# Patient Record
Sex: Male | Born: 1990 | Race: Black or African American | Hispanic: No | Marital: Single | Smoking: Current every day smoker
Health system: Southern US, Community
[De-identification: ages and names within clinical notes are randomized; demographics above are authoritative.]

## PROBLEM LIST (undated history)

## (undated) DIAGNOSIS — I10 Essential (primary) hypertension: Secondary | ICD-10-CM

## (undated) DIAGNOSIS — D869 Sarcoidosis, unspecified: Secondary | ICD-10-CM

---
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
## 2008-02-01 ENCOUNTER — Emergency Department: Payer: Self-pay | Admitting: Internal Medicine

## 2008-03-23 ENCOUNTER — Emergency Department: Payer: Self-pay | Admitting: Emergency Medicine

## 2009-02-05 ENCOUNTER — Emergency Department: Payer: Self-pay | Admitting: Unknown Physician Specialty

## 2009-10-01 ENCOUNTER — Emergency Department: Payer: Self-pay | Admitting: Emergency Medicine

## 2010-06-17 ENCOUNTER — Emergency Department: Payer: Self-pay | Admitting: Emergency Medicine

## 2011-05-20 ENCOUNTER — Emergency Department: Payer: Self-pay | Admitting: Emergency Medicine

## 2011-06-17 ENCOUNTER — Emergency Department: Payer: Self-pay | Admitting: Emergency Medicine

## 2012-08-08 ENCOUNTER — Emergency Department: Payer: Self-pay | Admitting: Emergency Medicine

## 2012-11-26 ENCOUNTER — Emergency Department: Payer: Self-pay | Admitting: Emergency Medicine

## 2012-11-29 LAB — BETA STREP CULTURE(ARMC)

## 2012-12-14 ENCOUNTER — Emergency Department: Payer: Self-pay | Admitting: Emergency Medicine

## 2013-04-22 ENCOUNTER — Emergency Department: Payer: Self-pay | Admitting: Emergency Medicine

## 2013-12-11 ENCOUNTER — Emergency Department: Payer: Self-pay | Admitting: Emergency Medicine

## 2014-01-09 ENCOUNTER — Emergency Department: Payer: Self-pay | Admitting: Emergency Medicine

## 2014-05-07 ENCOUNTER — Emergency Department: Payer: Self-pay | Admitting: Emergency Medicine

## 2014-06-23 ENCOUNTER — Emergency Department
Admission: EM | Admit: 2014-06-23 | Discharge: 2014-06-23 | Disposition: A | Discharge status: Home / Self Care | Payer: Self-pay | Attending: Emergency Medicine | Admitting: Emergency Medicine

## 2014-06-23 ENCOUNTER — Encounter: Payer: Self-pay | Admitting: Emergency Medicine

## 2014-06-23 DIAGNOSIS — M436 Torticollis: Secondary | ICD-10-CM | POA: Insufficient documentation

## 2014-06-23 DIAGNOSIS — Z720 Tobacco use: Secondary | ICD-10-CM | POA: Insufficient documentation

## 2014-06-23 MED ORDER — NAPROXEN 500 MG PO TABS: 500.0000 mg | ORAL_TABLET | Freq: Two times a day (BID) | ORAL | Status: AC

## 2014-06-23 NOTE — ED Notes (Signed)
C/o left neck pain started today, hurts to move,

## 2014-06-23 NOTE — ED Provider Notes (Signed)
[facility] Emergency Department Provider Note    ____________________________________________  Time seen: 1608  I have reviewed the triage vital signs and the nursing notes.   HISTORY  Chief Complaint Torticollis   HPI Patrick [name] is a 24 y.o. male who presents with pain in left side of his neck. Improved today after taking ibuprofen. Needs a work note.  History reviewed. No pertinent past medical history.  There are no active problems to display for this patient.   No past surgical history on file.  No current outpatient prescriptions on file.  Allergies Review of patient's allergies indicates no known allergies.  No family history on file.  Social History History  Substance Use Topics  . Smoking status: Current Every Day Smoker  . Smokeless tobacco: Not on file  . Alcohol Use: Yes    Review of Systems Constitutional: Negative for fever. Eyes: Negative for visual changes. ENT: Negative for sore throat. Cardiovascular: Negative for chest pain. Respiratory: Negative for shortness of breath. Gastrointestinal: Negative for abdominal pain, vomiting and diarrhea. Musculoskeletal: Negative for pain.  Skin: Negative for rash. Neurological: Negative for headaches, focal weakness or numbness. 10-point ROS otherwise negative.  ____________________________________________   PHYSICAL EXAM:  VITAL SIGNS: ED Triage Vitals  Enc Vitals Group     BP 06/23/14 1523 134/75 mmHg     Pulse Rate 06/23/14 1523 97     Resp 06/23/14 1523 20     Temp 06/23/14 1523 98.4 F (36.9 C)     Temp src --      SpO2 06/23/14 1523 99 %     Weight 06/23/14 1523 295 lb (133.811 kg)     Height 06/23/14 1523  (1.702 m)     Head Cir --      Peak Flow --      Pain Score 06/23/14 1524 8     Pain Loc --      Pain Edu? --      Excl. in GC? --     Constitutional: Alert and oriented. Well appearing and in no distress. Eyes: Conjunctivae are normal.  PERRL. Normal extraocular movements. ENT   Head: Normocephalic and atraumatic.   Nose: No congestion/rhinnorhea.   Mouth/Throat: Mucous membranes are moist.   Neck: No stridor. Pain isolated over left paraspinal muscles. Hematological/Lymphatic/Immunilogical: No cervical lymphadenopathy. Respiratory: Normal respiratory effort without tachypnea nor retractions. Musculoskeletal:Neck tender with turning to left. No joint effusions.  No lower extremity tenderness nor edema. Neurologic:  Normal speech and language. No gross focal neurologic deficits are appreciated. Speech is normal. No gait instability. Skin:  Skin is warm, dry and intact. No rash noted. Psychiatric: Mood and affect are normal. Speech and behavior are normal. Patient exhibits appropriate insight and judgment.  ____________________________________________    LABS (pertinent positives/negatives)  n/a  ____________________________________________   EKG  n/a  ____________________________________________    RADIOLOGY  n/a  ____________________________________________   PROCEDURES  Procedure(s) performed: None  Critical Care performed: No  ____________________________________________   INITIAL IMPRESSION / ASSESSMENT AND PLAN / ED COURSE  Pertinent labs & imaging results that were available during my care of the patient were reviewed by me and considered in my medical decision making (see chart for details).  Patient to follow up with the primary care provider for symptoms that do not improve over the next few days..  ____________________________________________   FINAL CLINICAL IMPRESSION(S) / ED DIAGNOSES  Final diagnoses:  Acute torticollis    Chinita Pester, FNP 06/23/14 1654  Loleta Roseory Forbach, MD 06/23/14 2108

## 2014-06-23 NOTE — ED Notes (Signed)
Pt reports waking up with neck pain this am, also reports chronic knee pain worse than usual

## 2014-10-25 IMAGING — CR DG KNEE COMPLETE 4+V*R*
1 series · 4 of 4 positions shown · non-contrast
Comparison: None.

CLINICAL DATA: Chronic medial right knee pain. The patient injured
the right knee several years ago.

EXAM:
RIGHT KNEE - COMPLETE 4+ VIEW

[Series 1: dxr knee rt comp with obliques · 0.14mm/px · 4 of 4 slices shown]
[im 1/4]
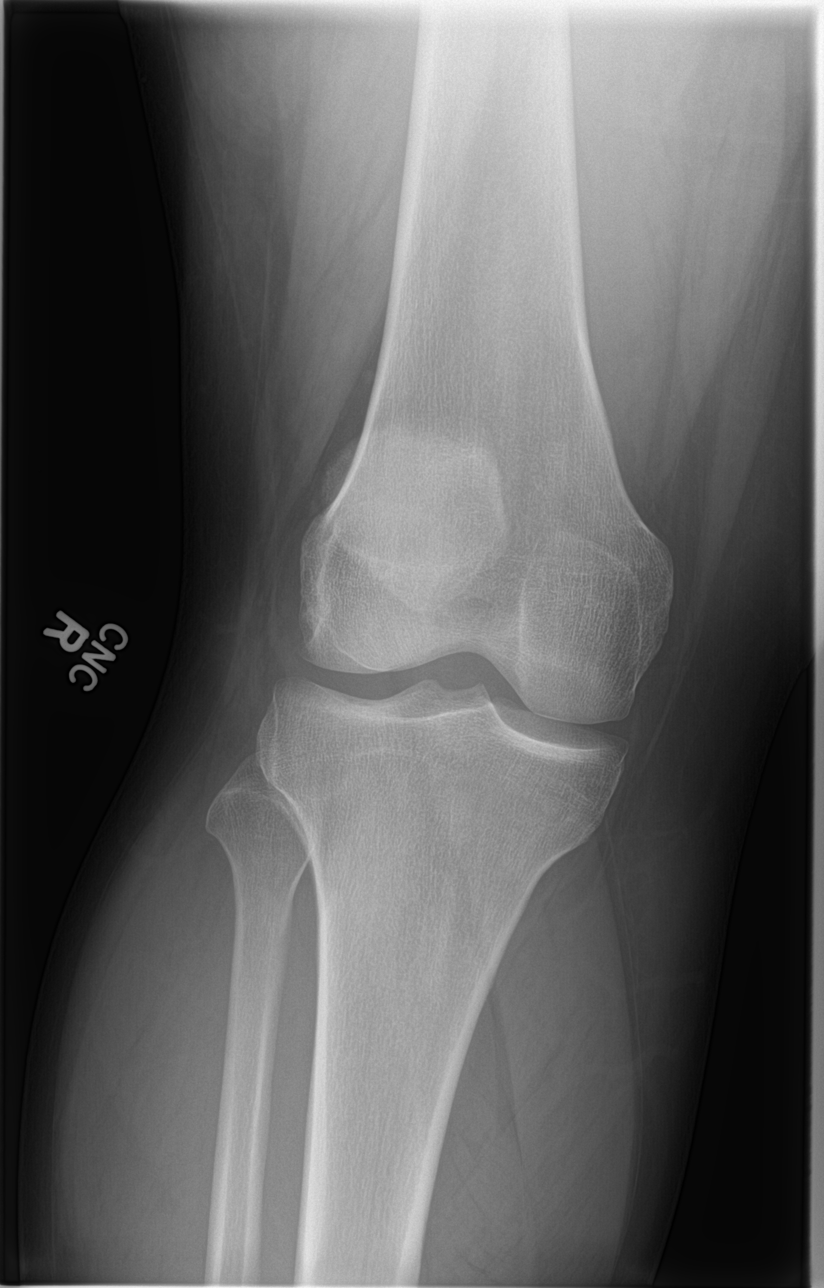
[im 2/4]
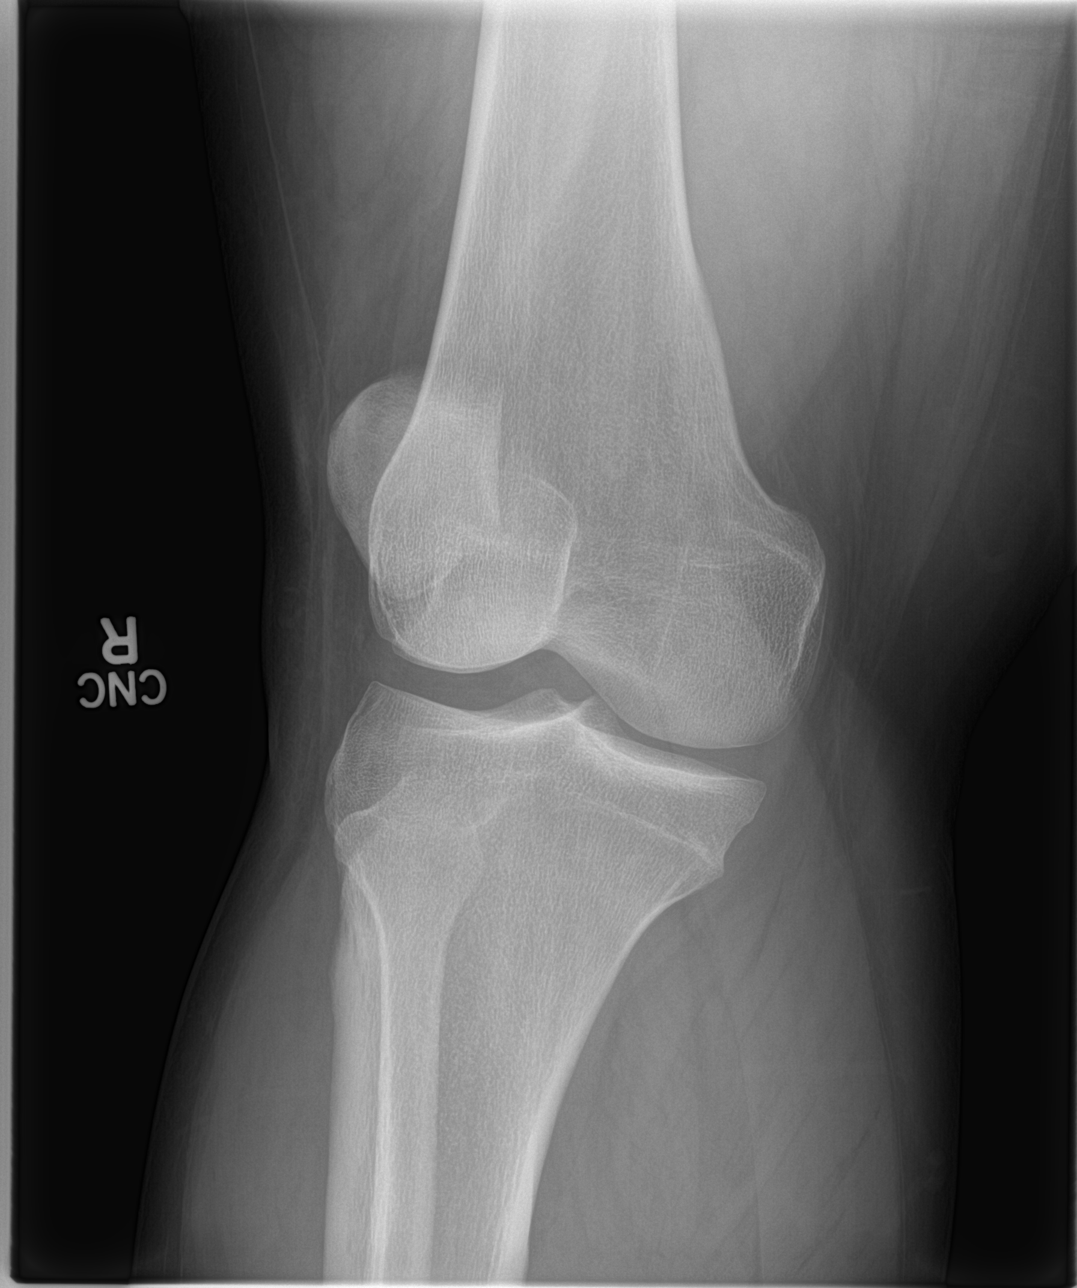
[im 3/4]
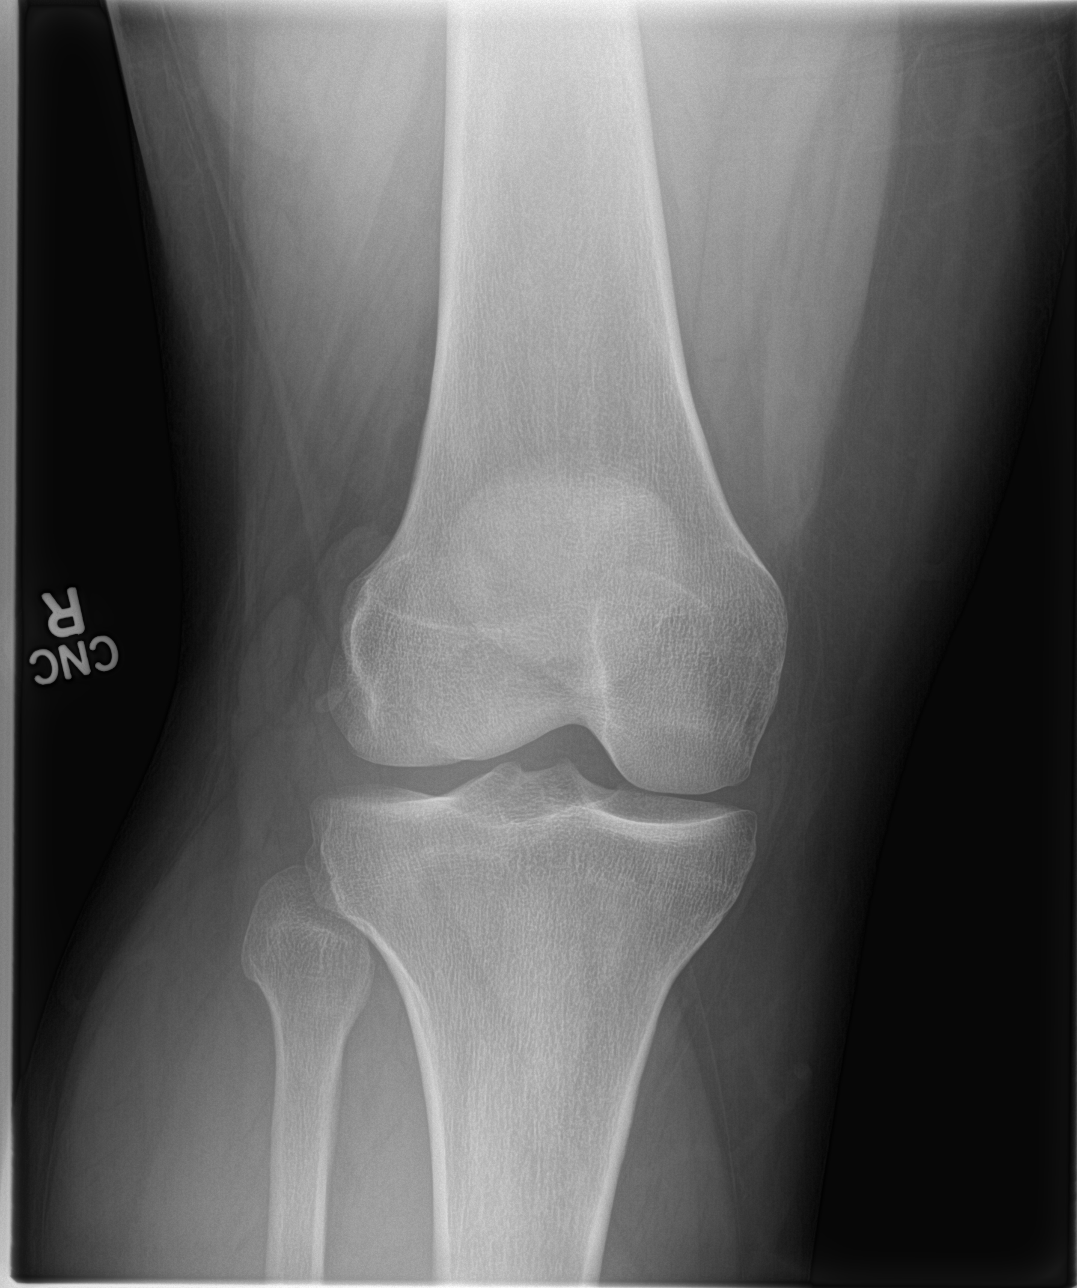
[im 4/4]
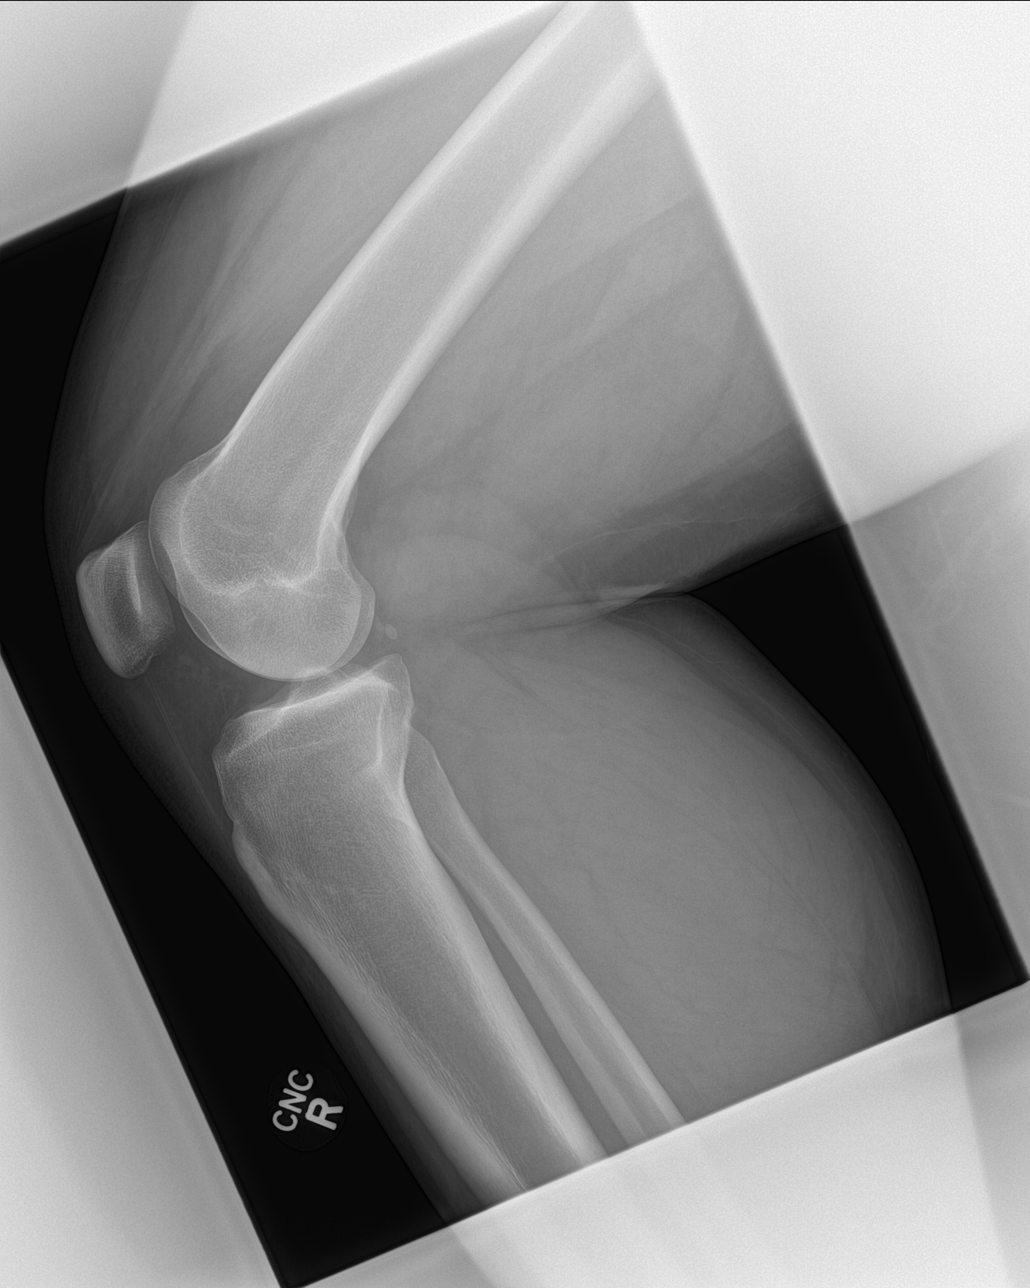

[4 of 4 positions shown; findings below may reference images not displayed]

FINDINGS: There is no evidence of fracture, dislocation, or joint effusion.
There is no evidence of arthropathy or other focal bone abnormality.
Soft tissues are unremarkable.
IMPRESSION: Negative.

## 2015-05-12 ENCOUNTER — Emergency Department
Admission: EM | Admit: 2015-05-12 | Discharge: 2015-05-12 | Disposition: A | Discharge status: Home / Self Care | Payer: Self-pay | Attending: Emergency Medicine | Admitting: Emergency Medicine

## 2015-05-12 ENCOUNTER — Encounter: Payer: Self-pay | Admitting: Emergency Medicine

## 2015-05-12 DIAGNOSIS — M62838 Other muscle spasm: Secondary | ICD-10-CM | POA: Insufficient documentation

## 2015-05-12 MED ORDER — CARISOPRODOL 350 MG PO TABS: 350.0000 mg | ORAL_TABLET | Freq: Three times a day (TID) | ORAL | Status: DC | PRN

## 2015-05-12 NOTE — ED Notes (Addendum)
Patient ambulatory to triage with steady gait, without difficulty or distress noted ; pt reports "lifts a lot at work"; since last night having muscle spasms in forearms bilat with no accomp symptoms

## 2015-05-12 NOTE — Discharge Instructions (Signed)
Make sure to drink plenty of fluids. Rest the muscles today and make sure to stretch throughout the day. He may also try protein drinks/shakes to help build up the muscle. Muscle Cramps and Spasms Muscle cramps and spasms are when muscles tighten by themselves. They usually get better within minutes. Muscle cramps are painful. They are usually stronger and last longer than muscle spasms. Muscle spasms may or may not be painful. They can last a few seconds or much longer. HOME CARE  Drink enough fluid to keep your pee (urine) clear or pale yellow.  Massage, stretch, and relax the muscle.  Use a warm towel, heating pad, or warm shower water on tight muscles.  Place ice on the muscle if it is tender or in pain.  Put ice in a plastic bag.  Place a towel between your skin and the bag.  Leave the ice on for 15-20 minutes, 03-04 times a day.  Only take medicine as told by your doctor. GET HELP RIGHT AWAY IF:  Your cramps or spasms get worse, happen more often, or do not get better with time. MAKE SURE YOU:  Understand these instructions.  Will watch your condition.  Will get help right away if you are not doing well or get worse.   This information is not intended to replace advice given to you by your health care provider. Make sure you discuss any questions you have with your health care provider.   Document Released: 01/20/2008 Document Revised: 06/03/2012 Document Reviewed: 01/24/2012 Elsevier Interactive Patient Education [employer]2016 [employer] Inc[employer]

## 2015-05-12 NOTE — ED Provider Notes (Signed)
[facility] Emergency Department Provider Note  ____________________________________________  Time seen: Approximately 715 AM  I have reviewed the triage vital signs and the nursing notes.   HISTORY  Chief Complaint Spasms    HPI Patrick [name] is a 25 y.o. male without any chronic medical problems who is presenting to the emergency department today with bilateral forearm spasm. He says that he started a new job just this past Monday where he lifts 100-190 pounds at a time. He says that starting yesterday he began to have spasms in his forearms laterally. He says that the spasm continued through the night and into this morning. He says that the spasm is excruciating and he says he was in tears from an earlier. He has not experiencing any spasm or pain at this time. He says he does not drink a lot of water. Does not routinely use these muscles to the extent that he has started using over the past several days.   History reviewed. No pertinent past medical history.  There are no active problems to display for this patient.   History reviewed. No pertinent past surgical history.  Current Outpatient Rx  Name  Route  Sig  Dispense  Refill  . naproxen (NAPROSYN) 500 MG tablet   Oral   Take 1 tablet (500 mg total) by mouth 2 (two) times daily with a meal.   60 tablet   0     Allergies Review of patient's allergies indicates no known allergies.  No family history on file.  Social History Social History  Substance Use Topics  . Smoking status: Current Every Day Smoker  . Smokeless tobacco: None  . Alcohol Use: Yes    Review of Systems Constitutional: No fever/chills Eyes: No visual changes. ENT: No sore throat. Cardiovascular: Denies chest pain. Respiratory: Denies shortness of breath. Gastrointestinal: No abdominal pain.  No nausea, no vomiting.  No diarrhea.  No constipation. Genitourinary: Negative for dysuria. Musculoskeletal: Negative for  back pain. Skin: Negative for rash. Neurological: Negative for headaches, focal weakness or numbness.  10-point ROS otherwise negative.  ____________________________________________   PHYSICAL EXAM:  VITAL SIGNS: ED Triage Vitals  Enc Vitals Group     BP 05/12/15 0644 138/87 mmHg     Pulse Rate 05/12/15 0644 90     Resp 05/12/15 0644 20     Temp 05/12/15 0644 98 F (36.7 C)     Temp Source 05/12/15 0644 Oral     SpO2 05/12/15 0644 97 %     Weight 05/12/15 0644 314 lb (142.429 kg)     Height 05/12/15 0644  (1.702 m)     Head Cir --      Peak Flow --      Pain Score 05/12/15 0643 7     Pain Loc --      Pain Edu? --      Excl. in GC? --     Constitutional: Alert and oriented. Well appearing and in no acute distress. Eyes: Conjunctivae are normal. PERRL. EOMI. Head: Atraumatic. Nose: No congestion/rhinnorhea. Mouth/Throat: Mucous membranes are moist.   Neck: No stridor.   Cardiovascular: Normal rate, regular rhythm. Grossly normal heart sounds.  Good peripheral circulation. Respiratory: Normal respiratory effort.  No retractions. Lungs CTAB. Gastrointestinal: Soft and nontender. No distention.  Musculoskeletal: No lower extremity tenderness nor edema.  No joint effusions.Muscle bellies in the lateral forearms bilaterally are very mildly tender but not spasming at this time. There is no rigidity or  swelling to the compartments. Distally, the patient has intact bilateral and equal radial pulses. He has 5 out of 5 grip strength bilaterally and is sensate to light touch bilaterally to the hands and forearms. Neurologic:  Normal speech and language. No gross focal neurologic deficits are appreciated. No gait instability. Skin:  Skin is warm, dry and intact. No rash noted. Psychiatric: Mood and affect are normal. Speech and behavior are normal.  ____________________________________________   LABS (all labs ordered are listed, but only abnormal results are displayed)  Labs  Reviewed - No data to display ____________________________________________  EKG   ____________________________________________  RADIOLOGY   ____________________________________________   PROCEDURES   ____________________________________________   INITIAL IMPRESSION / ASSESSMENT AND PLAN / ED COURSE  Pertinent labs & imaging results that were available during my care of the patient were reviewed by me and considered in my medical decision making (see chart for details).  We'll give patient a work note for the most the day today. I recommended muscle cream such as Aspercreme or icy hot. He will also use ibuprofen prophylactically for the next 2-3 days before going to work. I also gave the patient a reference sheet for several forearm stretches that he can do before, after and during work. Spasm likely secondary to patient starting new job and over exerting himself in a way that he has not done previously. ____________________________________________   FINAL CLINICAL IMPRESSION(S) / ED DIAGNOSES  Muscle spasm.    Myrna Blazeravid Matthew Schaevitz, MD 05/12/15 [phone]

## 2015-05-20 ENCOUNTER — Emergency Department
Admission: EM | Admit: 2015-05-20 | Discharge: 2015-05-20 | Disposition: A | Discharge status: Home / Self Care | Payer: Self-pay | Attending: Emergency Medicine | Admitting: Emergency Medicine

## 2015-05-20 ENCOUNTER — Encounter: Payer: Self-pay | Admitting: Emergency Medicine

## 2015-05-20 DIAGNOSIS — Y9389 Activity, other specified: Secondary | ICD-10-CM | POA: Insufficient documentation

## 2015-05-20 DIAGNOSIS — S66911A Strain of unspecified muscle, fascia and tendon at wrist and hand level, right hand, initial encounter: Secondary | ICD-10-CM | POA: Insufficient documentation

## 2015-05-20 DIAGNOSIS — Y9289 Other specified places as the place of occurrence of the external cause: Secondary | ICD-10-CM | POA: Insufficient documentation

## 2015-05-20 DIAGNOSIS — IMO0001 Reserved for inherently not codable concepts without codable children: Secondary | ICD-10-CM

## 2015-05-20 DIAGNOSIS — X500XXA Overexertion from strenuous movement or load, initial encounter: Secondary | ICD-10-CM | POA: Insufficient documentation

## 2015-05-20 DIAGNOSIS — Y998 Other external cause status: Secondary | ICD-10-CM | POA: Insufficient documentation

## 2015-05-20 DIAGNOSIS — F17200 Nicotine dependence, unspecified, uncomplicated: Secondary | ICD-10-CM | POA: Insufficient documentation

## 2015-05-20 MED ORDER — TRAMADOL HCL 50 MG PO TABS: 50.0000 mg | ORAL_TABLET | Freq: Four times a day (QID) | ORAL | Status: DC | PRN

## 2015-05-20 MED ORDER — NAPROXEN 500 MG PO TABS
500.0000 mg | ORAL_TABLET | Freq: Once | ORAL | Status: AC
  Administered 2015-05-20: 500 mg via ORAL
  Filled 2015-05-20: qty 1

## 2015-05-20 MED ORDER — TRAMADOL HCL 50 MG PO TABS
50.0000 mg | ORAL_TABLET | Freq: Once | ORAL | Status: AC
  Administered 2015-05-20: 50 mg via ORAL
  Filled 2015-05-20: qty 1

## 2015-05-20 MED ORDER — NAPROXEN 500 MG PO TABS: 500.0000 mg | ORAL_TABLET | Freq: Two times a day (BID) | ORAL | Status: DC

## 2015-05-20 NOTE — ED Notes (Signed)
Patient ambulatory to triage with steady gait, without difficulty or distress noted; pt reports right hand swelling with no known injury

## 2015-05-20 NOTE — ED Provider Notes (Signed)
[facility] Emergency Department Provider Note  ____________________________________________  Time seen: Approximately 7:40 AM  I have reviewed the triage vital signs and the nursing notes.   HISTORY  Chief Complaint Hand Problem    HPI Patrick [name] is a 25 y.o. male patient complaining of right hand and wrist pain for 2-3 days. Patient stated this swelling to the right hand which is not relieved with ibuprofen. Patient state he recently started jaw which he does repetitive heavy lifting. Denies any other provocative measures for this complaint. Patient rates his pain as 8/10. Patient described a pain as "achy". Patient is right-hand dominant. No other palliative measures for this complaint.   History reviewed. No pertinent past medical history.  There are no active problems to display for this patient.   History reviewed. No pertinent past surgical history.  Current Outpatient Rx  Name  Route  Sig  Dispense  Refill  . carisoprodol (SOMA) 350 MG tablet   Oral   Take 1 tablet (350 mg total) by mouth 3 (three) times daily as needed for muscle spasms.   15 tablet   0   . naproxen (NAPROSYN) 500 MG tablet   Oral   Take 1 tablet (500 mg total) by mouth 2 (two) times daily with a meal.   60 tablet   0   . naproxen (NAPROSYN) 500 MG tablet   Oral   Take 1 tablet (500 mg total) by mouth 2 (two) times daily with a meal.   20 tablet   0   . traMADol (ULTRAM) 50 MG tablet   Oral   Take 1 tablet (50 mg total) by mouth every 6 (six) hours as needed.   20 tablet   0     Allergies Review of patient's allergies indicates no known allergies.  No family history on file.  Social History Social History  Substance Use Topics  . Smoking status: Current Every Day Smoker  . Smokeless tobacco: None  . Alcohol Use: Yes   Review of Systems Constitutional: No fever/chills Eyes: No visual changes. ENT: No sore throat. Cardiovascular: Denies chest  pain. Respiratory: Denies shortness of breath. Gastrointestinal: No abdominal pain.  No nausea, no vomiting.  No diarrhea.  No constipation. Genitourinary: Negative for dysuria. Musculoskeletal: Right hand pain  Skin: Negative for rash. Neurological: Negative for headaches, focal weakness or numbness.     ____________________________________________   PHYSICAL EXAM:  VITAL SIGNS: ED Triage Vitals  Enc Vitals Group     BP 05/20/15 0702 159/98 mmHg     Pulse Rate 05/20/15 0702 84     Resp 05/20/15 0702 18     Temp 05/20/15 0702 97.6 F (36.4 C)     Temp Source 05/20/15 0702 Oral     SpO2 05/20/15 0702 98 %     Weight 05/20/15 0702 314 lb (142.429 kg)     Height 05/20/15 0702 5\' 7"  (1.702 m)     Head Cir --      Peak Flow --      Pain Score 05/20/15 0701 8     Pain Loc --      Pain Edu? --      Excl. in GC? --     Constitutional: Alert and oriented. Well appearing and in no acute distress. Eyes: Conjunctivae are normal. PERRL. EOMI. Head: Atraumatic. Nose: No congestion/rhinnorhea. Mouth/Throat: Mucous membranes are moist.  Oropharynx non-erythematous. Neck: No stridor. No cervical spine tenderness to palpation. Hematological/Lymphatic/Immunilogical: No cervical lymphadenopathy. Cardiovascular:  Normal rate, regular rhythm. Grossly normal heart sounds.  Good peripheral circulation. Respiratory: Normal respiratory effort.  No retractions. Lungs CTAB. Gastrointestinal: Soft and nontender. No distention. No abdominal bruits. No CVA tenderness. Musculoskeletal: No obvious deformity of the right hand and wrist. Moderate edema full and equal range of motion. Grip strength decrease 3 over 5 in comparison to the left hand. Neurologic:  Normal speech and language. No gross focal neurologic deficits are appreciated. No gait instability. Skin:  Skin is warm, dry and intact. No rash noted. No erythema. Psychiatric: Mood and affect are normal. Speech and behavior are  normal.  ____________________________________________   LABS (all labs ordered are listed, but only abnormal results are displayed)  Labs Reviewed - No data to display ____________________________________________  EKG   ____________________________________________  RADIOLOGY   ____________________________________________   PROCEDURES  Procedure(s) performed: None  Critical Care performed: No  ____________________________________________   INITIAL IMPRESSION / ASSESSMENT AND PLAN / ED COURSE  Pertinent labs & imaging results that were available during my care of the patient were reviewed by me and considered in my medical decision making (see chart for details).  Strain right hand. Patient given a splint and advised no heavy lifting for 3-5 days. Patient given a prescription for naproxen and tramadol. Patient given discharge Instructions and advised to [facility] condition [facility]. ____________________________________________   FINAL CLINICAL IMPRESSION(S) / ED DIAGNOSES  Final diagnoses:  Strain of right hand and finger, initial encounter      Joni Reining, PA-C 05/20/15 1610  Jene Every, MD 05/20/15 [phone]

## 2015-05-20 NOTE — Discharge Instructions (Signed)
Wear his splint for 3-5 days

## 2015-05-20 NOTE — ED Notes (Signed)
States he developed pain with min swelling to right hand this week. Denies any trauma to hand but does a lot of lifting at work  Positive pulses and good sensation

## 2015-10-01 ENCOUNTER — Emergency Department
Admission: EM | Admit: 2015-10-01 | Discharge: 2015-10-01 | Disposition: A | Discharge status: Home / Self Care | Payer: Self-pay | Attending: Student | Admitting: Student

## 2015-10-01 DIAGNOSIS — F17200 Nicotine dependence, unspecified, uncomplicated: Secondary | ICD-10-CM | POA: Insufficient documentation

## 2015-10-01 DIAGNOSIS — S39012A Strain of muscle, fascia and tendon of lower back, initial encounter: Secondary | ICD-10-CM | POA: Insufficient documentation

## 2015-10-01 DIAGNOSIS — Y9361 Activity, american tackle football: Secondary | ICD-10-CM | POA: Insufficient documentation

## 2015-10-01 DIAGNOSIS — Y999 Unspecified external cause status: Secondary | ICD-10-CM | POA: Insufficient documentation

## 2015-10-01 DIAGNOSIS — X58XXXA Exposure to other specified factors, initial encounter: Secondary | ICD-10-CM | POA: Insufficient documentation

## 2015-10-01 DIAGNOSIS — Y929 Unspecified place or not applicable: Secondary | ICD-10-CM | POA: Insufficient documentation

## 2015-10-01 MED ORDER — IBUPROFEN 800 MG PO TABS
800.0000 mg | ORAL_TABLET | Freq: Once | ORAL | Status: AC
  Administered 2015-10-01: 800 mg via ORAL
  Filled 2015-10-01: qty 1

## 2015-10-01 MED ORDER — CYCLOBENZAPRINE HCL 10 MG PO TABS
10.0000 mg | ORAL_TABLET | Freq: Once | ORAL | Status: AC
  Administered 2015-10-01: 10 mg via ORAL
  Filled 2015-10-01: qty 1

## 2015-10-01 MED ORDER — BACLOFEN 10 MG PO TABS: 10.0000 mg | ORAL_TABLET | Freq: Three times a day (TID) | ORAL | 0 refills | Status: DC

## 2015-10-01 MED ORDER — NAPROXEN 500 MG PO TABS: 500.0000 mg | ORAL_TABLET | Freq: Two times a day (BID) | ORAL | 0 refills | Status: DC

## 2015-10-01 NOTE — ED Triage Notes (Signed)
Pt reports chronic back pain from football injury, reports lower back spasms x1 day. Denies recent injury.

## 2015-10-01 NOTE — ED Provider Notes (Signed)
[facility] Emergency Department Provider Note  ____________________________________________  Time seen: Approximately 3:53 PM  I have reviewed the triage vital signs and the nursing notes.   HISTORY  Chief Complaint Back Pain    HPI Patrick [name] is a 25 y.o. male presents for evaluation of low back spasms. Patient states he woke up this morning with severe pain. Has history of lifting heavy boxes at work and has had no focal injury. Denies any recent relief or trauma to his lower back. Denies any radiation of pain. Denies any numbness or tingling in the groin.   No past medical history on file.  There are no active problems to display for this patient.   No past surgical history on file.  Prior to Admission medications   Medication Sig Start Date End Date Taking? Authorizing Provider  baclofen (LIORESAL) 10 MG tablet Take 1 tablet (10 mg total) by mouth 3 (three) times daily. 10/01/15   Evangeline Dakin, PA-C  naproxen (NAPROSYN) 500 MG tablet Take 1 tablet (500 mg total) by mouth 2 (two) times daily with a meal. 10/01/15   Evangeline Dakin, PA-C    Allergies Review of patient's allergies indicates no known allergies.  No family history on file.  Social History Social History  Substance Use Topics  . Smoking status: Current Every Day Smoker  . Smokeless tobacco: Not on file  . Alcohol use Yes    Review of Systems Constitutional: No fever/chills Cardiovascular: Denies chest pain. Respiratory: Denies shortness of breath. Gastrointestinal: No abdominal pain.  No nausea, no vomiting.  No diarrhea.  No constipation. Genitourinary: Negative for dysuria. Musculoskeletal: Positive for low back pain. Skin: Negative for rash. Neurological: Negative for headaches, focal weakness or numbness.  10-point ROS otherwise negative.  ____________________________________________   PHYSICAL EXAM:  VITAL SIGNS: ED Triage Vitals  Enc Vitals Group   BP 10/01/15 1543 (!) 146/86     Pulse Rate 10/01/15 1543 (!) 103     Resp 10/01/15 1543 20     Temp 10/01/15 1543 98.1 F (36.7 C)     Temp Source 10/01/15 1543 Oral     SpO2 10/01/15 1543 97 %     Weight 10/01/15 1542 (!) 308 lb (139.7 kg)     Height 10/01/15 1542  (1.702 m)     Head Circumference --      Peak Flow --      Pain Score 10/01/15 1543 9     Pain Loc --      Pain Edu? --      Excl. in GC? --     Constitutional: Alert and oriented. Well appearing and in no acute distress. Cardiovascular: Normal rate, regular rhythm. Grossly normal heart sounds.  Good peripheral circulation. Respiratory: Normal respiratory effort.  No retractions. Lungs CTAB. Gastrointestinal: Soft and nontender. No distention. No abdominal bruits. No CVA tenderness. Musculoskeletal: No lower extremity tenderness nor edema.  No joint effusions. Neurologic:  Normal speech and language. No gross focal neurologic deficits are appreciated. No gait instability. Straight leg raise unremarkable bilaterally. Distal neurovascularly intact. Skin:  Skin is warm, dry and intact. No rash noted. Psychiatric: Mood and affect are normal. Speech and behavior are normal.  ____________________________________________   LABS (all labs ordered are listed, but only abnormal results are displayed)  Labs Reviewed - No data to display ____________________________________________  EKG   ____________________________________________  RADIOLOGY   ____________________________________________   PROCEDURES  Procedure(s) performed: None  Critical Care performed: No  ____________________________________________   INITIAL IMPRESSION / ASSESSMENT AND PLAN / ED COURSE  Pertinent labs & imaging results that were available during my care of the patient were reviewed by me and considered in my medical decision making (see chart for details). Review of the [place] CSRS was performed in accordance of the [facility] prior to  dispensing any controlled drugs.  Acute exacerbation of lumbar strain. Rx given for Bactroban and Naprosyn.  Clinical Course    ____________________________________________   FINAL CLINICAL IMPRESSION(S) / ED DIAGNOSES  Final diagnoses:  Lumbar strain, initial encounter     This chart was dictated using voice recognition software/Dragon. Despite best efforts to proofread, errors can occur which can change the meaning. Any change was purely unintentional.    Evangeline Dakinharles M Mozetta Murfin, PA-C 10/01/15 1639    Gayla DossEryka A Gayle, MD 10/01/15 [phone]

## 2016-05-03 ENCOUNTER — Emergency Department
Admission: EM | Admit: 2016-05-03 | Discharge: 2016-05-03 | Disposition: A | Discharge status: Home / Self Care | Payer: Self-pay | Attending: Emergency Medicine | Admitting: Emergency Medicine

## 2016-05-03 ENCOUNTER — Emergency Department: Payer: Self-pay

## 2016-05-03 ENCOUNTER — Encounter: Payer: Self-pay | Admitting: Emergency Medicine

## 2016-05-03 DIAGNOSIS — F17200 Nicotine dependence, unspecified, uncomplicated: Secondary | ICD-10-CM | POA: Insufficient documentation

## 2016-05-03 DIAGNOSIS — J209 Acute bronchitis, unspecified: Secondary | ICD-10-CM | POA: Insufficient documentation

## 2016-05-03 MED ORDER — AZITHROMYCIN 250 MG PO TABS: ORAL_TABLET | ORAL | 0 refills | Status: DC

## 2016-05-03 NOTE — ED Triage Notes (Signed)
Pt presents with cough for one week. States his chest hurts from coughing so much.

## 2016-05-03 NOTE — ED Provider Notes (Signed)
[facility] Emergency Department Provider Note  ____________________________________________  Time seen: Approximately 3:17 PM  I have reviewed the triage vital signs and the nursing notes.   HISTORY  Chief Complaint Cough    HPI Patrick [name] is a 26 y.o. male presenting to the emergency department with productive cough for brown sputum production and purulent rhinorrhea for the past week. Patient states that approximately 7 days ago he had symptoms consistent with an upper respiratory tract infection. Patient's symptoms initially seemed to resolve. However, cough has persisted and has seemingly worsened. Associated symptoms also include shortness of breath and fatigue. Patient takes no medications daily. Patient  works with medical equipment for a living. Patient smokes "a third of a pack of cigarettes daily." No alleviating measures have been attempted. Patient has been afebrile. Patient denies chest pain, chest tightness, nausea, vomiting and abdominal pain. Patient is accompanied by his wife.   History reviewed. No pertinent past medical history.  There are no active problems to display for this patient.   History reviewed. No pertinent surgical history.  Prior to Admission medications   Medication Sig Start Date End Date Taking? Authorizing Provider  azithromycin (ZITHROMAX) 250 MG tablet Take 2 tablets by mouth on the first day. Take 1 tablet by mouth daily on days 2 through 5. 05/03/16   Orvil FeilJaclyn M Abhimanyu Cruces, PA-C  baclofen (LIORESAL) 10 MG tablet Take 1 tablet (10 mg total) by mouth 3 (three) times daily. 10/01/15   Evangeline Dakinharles M Beers, PA-C  naproxen (NAPROSYN) 500 MG tablet Take 1 tablet (500 mg total) by mouth 2 (two) times daily with a meal. 10/01/15   Evangeline Dakinharles M Beers, PA-C    Allergies Patient has no known allergies.  No family history on file.  Social History Social History  Substance Use Topics  . Smoking status: Current Every Day Smoker  .  Smokeless tobacco: Never Used  . Alcohol use Yes    Review of Systems  Constitutional: No fever/chills Eyes: No visual changes. No discharge ENT: Patient has had rhinorrhea. Cardiovascular: no chest pain. Respiratory: Patient has had productive cough and shortness of breath. Gastrointestinal: No abdominal pain. No nausea, no vomiting. No diarrhea.  No constipation. Musculoskeletal: Negative for musculoskeletal pain. Skin: Negative for rash, abrasions, lacerations, ecchymosis. Neurological: Negative for headaches, focal weakness or numbness. ____________________________________________   PHYSICAL EXAM:  VITAL SIGNS: ED Triage Vitals [05/03/16 1430]  Enc Vitals Group     BP      Pulse Rate (!) 113     Resp 16     Temp 98.9 F (37.2 C)     Temp Source Oral     SpO2 97 %     Weight 298 lb (135.2 kg)     Height 5\' 7"  (1.702 m)     Head Circumference      Peak Flow      Pain Score 7     Pain Loc      Pain Edu?      Excl. in GC?     Constitutional: Alert and oriented. Well appearing and in no acute distress. Eyes: Conjunctivae are normal. PERRL. EOMI. Head: Atraumatic. ENT:      Ears: Tympanic membranes are pearly bilaterally without erythema, effusion or purulent exudate.      Nose: No congestion/rhinnorhea.      Mouth/Throat: Mucous membranes are moist.  Neck: Full range of motion. Hematological/Lymphatic/Immunilogical: No cervical lymphadenopathy. Cardiovascular: Mild tachycardia, regular rhythm. Normal S1 and S2.  Good peripheral  circulation. Respiratory: Normal respiratory effort without tachypnea or retractions. Lungs CTAB. Good air entry to the bases with no decreased or absent breath sounds. Gastrointestinal: Bowel sounds 4 quadrants. Soft and nontender to palpation. No guarding or rigidity. No palpable masses. No distention. No CVA tenderness. Musculoskeletal: Full range of motion to all extremities. No gross deformities appreciated. Neurologic:  Normal speech  and language. No gross focal neurologic deficits are appreciated.  Skin:  Skin is warm, dry and intact. No rash noted. Psychiatric: Mood and affect are normal. Speech and behavior are normal. Patient exhibits appropriate insight and judgement.   ____________________________________________   LABS (all labs ordered are listed, but only abnormal results are displayed)  Labs Reviewed - No data to display ____________________________________________  EKG   ____________________________________________  RADIOLOGY Geraldo Pitter, personally viewed and evaluated these images (plain radiographs) as part of my medical decision making, as well as reviewing the written report by the radiologist.  No results found. DG chest is consistent with mild bronchitis. ____________________________________________    PROCEDURES  Procedure(s) performed:    Procedures    Medications - No data to display   ____________________________________________   INITIAL IMPRESSION / ASSESSMENT AND PLAN / ED COURSE  Pertinent labs & imaging results that were available during my care of the patient were reviewed by me and considered in my medical decision making (see chart for details).  Review of the [place] CSRS was performed in accordance of the [facility] prior to dispensing any controlled drugs.     Assessment and plan: Acute Bronchitis  Patient presents to the emergency department with productive cough for brown sputum production for the past week. Associated symptoms also include shortness of breath and fatigue. DG chest indicates findings consistent with acute bronchitis. Patient was discharged with azithromycin. Vital signs are reassuring aside from mild tachycardia. Patient was advised to follow-up with his primary care provider in one week. All patient questions were answered.  ____________________________________________  FINAL CLINICAL IMPRESSION(S) / ED DIAGNOSES  Final diagnoses:  Acute  bronchitis, unspecified organism      NEW MEDICATIONS STARTED DURING THIS VISIT:  Discharge Medication List as of 05/03/2016  4:56 PM    START taking these medications   Details  azithromycin (ZITHROMAX) 250 MG tablet Take 2 tablets by mouth on the first day. Take 1 tablet by mouth daily on days 2 through 5., Print            This chart was dictated using voice recognition software/Dragon. Despite best efforts to proofread, errors can occur which can change the meaning. Any change was purely unintentional.    Orvil Feil, PA-C 05/04/16 1745    Phineas Semen, MD 05/06/16 [phone]

## 2016-10-05 ENCOUNTER — Emergency Department
Admission: EM | Admit: 2016-10-05 | Discharge: 2016-10-05 | Disposition: A | Discharge status: Home / Self Care | Payer: Self-pay | Attending: Emergency Medicine | Admitting: Emergency Medicine

## 2016-10-05 ENCOUNTER — Emergency Department: Payer: Self-pay

## 2016-10-05 ENCOUNTER — Encounter: Payer: Self-pay | Admitting: Emergency Medicine

## 2016-10-05 DIAGNOSIS — R51 Headache: Secondary | ICD-10-CM | POA: Insufficient documentation

## 2016-10-05 DIAGNOSIS — R110 Nausea: Secondary | ICD-10-CM | POA: Insufficient documentation

## 2016-10-05 DIAGNOSIS — R519 Headache, unspecified: Secondary | ICD-10-CM

## 2016-10-05 DIAGNOSIS — F17210 Nicotine dependence, cigarettes, uncomplicated: Secondary | ICD-10-CM | POA: Insufficient documentation

## 2016-10-05 DIAGNOSIS — R42 Dizziness and giddiness: Secondary | ICD-10-CM | POA: Insufficient documentation

## 2016-10-05 LAB — BASIC METABOLIC PANEL
Anion gap: 6 (ref 5–15)
BUN: 14 mg/dL (ref 6–20)
CO2: 26 mmol/L (ref 22–32)
Calcium: 8.6 mg/dL — ABNORMAL LOW (ref 8.9–10.3)
Chloride: 108 mmol/L (ref 101–111)
Creatinine, Ser: 1.22 mg/dL (ref 0.61–1.24)
GFR calc Af Amer: >60 mL/min (ref >60)
GFR calc non Af Amer: >60 mL/min (ref >60)
GLUCOSE: 105 mg/dL — AB (ref 65–99)
POTASSIUM: 4.1 mmol/L (ref 3.5–5.1)
SODIUM: 140 mmol/L (ref 135–145)

## 2016-10-05 LAB — CBC
HEMATOCRIT: 38 % — AB (ref 40.0–52.0)
HEMOGLOBIN: 12.8 g/dL — AB (ref 13.0–18.0)
MCH: 27.9 pg (ref 26.0–34.0)
MCHC: 33.8 g/dL (ref 32.0–36.0)
MCV: 82.7 fL (ref 80.0–100.0)
Platelets: 244 10*3/uL (ref 150–440)
RBC: 4.59 MIL/uL (ref 4.40–5.90)
RDW: 13.3 % (ref 11.5–14.5)
WBC: 5.8 10*3/uL (ref 3.8–10.6)

## 2016-10-05 MED ORDER — ONDANSETRON 4 MG PO TBDP
4.0000 mg | ORAL_TABLET | Freq: Once | ORAL | Status: AC
  Administered 2016-10-05: 4 mg via ORAL

## 2016-10-05 MED ORDER — ONDANSETRON 4 MG PO TBDP: 4.0000 mg | ORAL_TABLET | Freq: Three times a day (TID) | ORAL | 0 refills | Status: DC | PRN

## 2016-10-05 NOTE — ED Notes (Signed)
Patient did not want to have blood drawn in triage without finance and does not like needles  nd so nurse got EKG in triage and then went and got finance to brought patient back to a room with finance and told primary nurse blood had not been drawn yet

## 2016-10-05 NOTE — ED Notes (Signed)
EDP at bedside  

## 2016-10-05 NOTE — ED Provider Notes (Signed)
[facility] Emergency Department Provider Note  Time seen: 8:42 AM  I have reviewed the triage vital signs and the nursing notes.   HISTORY  Chief Complaint Dizziness and Headache    HPI Patrick [name] is a 26 y.o. male with no past medical history who presents to the emergency department with intermittent headache, nausea. According to the patient for the past 2 months he has intermittently gotten headaches with some dizziness-type symptoms. He has felt nauseated for the past 2 days although denies vomiting. Denies diarrhea or abdominal pain. Denies fever. Patient states she was postictal at work this morning but felt too nauseous to go to work to came to the ER.  History reviewed. No pertinent past medical history.  There are no active problems to display for this patient.   History reviewed. No pertinent surgical history.  Prior to Admission medications   Medication Sig Start Date End Date Taking? Authorizing Provider  azithromycin (ZITHROMAX) 250 MG tablet Take 2 tablets by mouth on the first day. Take 1 tablet by mouth daily on days 2 through 5. 05/03/16   Orvil FeilWoods, Jaclyn M, PA-C  baclofen (LIORESAL) 10 MG tablet Take 1 tablet (10 mg total) by mouth 3 (three) times daily. 10/01/15   Beers, Charmayne Sheerharles M, PA-C  naproxen (NAPROSYN) 500 MG tablet Take 1 tablet (500 mg total) by mouth 2 (two) times daily with a meal. 10/01/15   Beers, Charmayne Sheerharles M, PA-C    No Known Allergies  History reviewed. No pertinent family history.  Social History Social History  Substance Use Topics  . Smoking status: Current Every Day Smoker    Packs/day: 0.50    Types: Cigarettes  . Smokeless tobacco: Never Used  . Alcohol use Yes    Review of Systems Constitutional: Negative for fever. ENT: Negative for congestion Cardiovascular: Negative for chest pain. Respiratory: Negative for shortness of breath. Gastrointestinal: Negative for abdominal pain. Positive for nausea. Negative  for vomiting or diarrhea. Genitourinary: Negative for dysuria. Neurological: Negative for headache All other ROS negative  ____________________________________________   PHYSICAL EXAM:  VITAL SIGNS: ED Triage Vitals  Enc Vitals Group     BP 10/05/16 0549 (!) 164/109     Pulse Rate 10/05/16 0549 89     Resp 10/05/16 0549 19     Temp 10/05/16 0549 97.7 F (36.5 C)     Temp src --      SpO2 10/05/16 0549 98 %     Weight 10/05/16 0549 (!) 314 lb (142.4 kg)     Height 10/05/16 0549 5\' 7"  (1.702 m)     Head Circumference --      Peak Flow --      Pain Score 10/05/16 0548 6     Pain Loc --      Pain Edu? --      Excl. in GC? --     Constitutional: Alert and oriented. Well appearing and in no distress. Eyes: Normal exam ENT   Head: Normocephalic and atraumatic   Mouth/Throat: Mucous membranes are moist. Cardiovascular: Normal rate, regular rhythm. No murmur Respiratory: Normal respiratory effort without tachypnea nor retractions. Breath sounds are clear  Gastrointestinal: Soft and nontender. No distention.  Musculoskeletal: Nontender with normal range of motion in all extremities.  Neurologic:  Normal speech and language. No gross focal neurologic deficits  Skin:  Skin is warm, dry and intact.  Psychiatric: Mood and affect are normal.   ____________________________________________    EKG  EKG reviewed and  interpreted by myself shows normal sinus rhythm at 91 bpm, narrow QRS, normal axis, normal intervals and no concerning ST changes. Normal EKG.  ____________________________________________    RADIOLOGY  CT head negative  ____________________________________________   INITIAL IMPRESSION / ASSESSMENT AND PLAN / ED COURSE  Pertinent labs & imaging results that were available during my care of the patient were reviewed by me and considered in my medical decision making (see chart for details).  Patient presents to the emergency department for dizziness and  intermittent nausea. States he's been having intermittent headaches for the past 2 months although denies any currently. Patient is feeling much better after ODT Zofran. Nontender abdominal exam. Labs are reassuring. CT head is negative. Patient will be discharged home with a course of Zofran to be used as needed. Patient agreeable.  ____________________________________________   FINAL CLINICAL IMPRESSION(S) / ED DIAGNOSES  Nausea Headache    Minna Antis, MD 10/05/16 [phone]

## 2016-10-05 NOTE — ED Triage Notes (Signed)
Patient states he has had intermittent dizziness for the last 1.485months with headaches. Patient states he feels like the rooms is spinning around him. Patient states tonight he started feeling nauseated and having diarrhea. Patient denies he has never felt like he has going to pass out and patient states that the headache is relieved by tylenol and then goes to sleep but when wake ups he is not dizzy.

## 2016-10-05 NOTE — ED Notes (Signed)
Pt resting in bed, resp even and unlabored, family at bedside 

## 2016-10-26 ENCOUNTER — Emergency Department
Admission: EM | Admit: 2016-10-26 | Discharge: 2016-10-26 | Disposition: A | Discharge status: Home / Self Care | Payer: Self-pay | Attending: Emergency Medicine | Admitting: Emergency Medicine

## 2016-10-26 DIAGNOSIS — F17210 Nicotine dependence, cigarettes, uncomplicated: Secondary | ICD-10-CM | POA: Insufficient documentation

## 2016-10-26 DIAGNOSIS — Z79899 Other long term (current) drug therapy: Secondary | ICD-10-CM | POA: Insufficient documentation

## 2016-10-26 DIAGNOSIS — G5603 Carpal tunnel syndrome, bilateral upper limbs: Secondary | ICD-10-CM | POA: Insufficient documentation

## 2016-10-26 DIAGNOSIS — Z791 Long term (current) use of non-steroidal anti-inflammatories (NSAID): Secondary | ICD-10-CM | POA: Insufficient documentation

## 2016-10-26 MED ORDER — PREDNISONE 10 MG PO TABS: ORAL_TABLET | ORAL | 0 refills | Status: DC

## 2016-10-26 MED ORDER — IBUPROFEN 600 MG PO TABS: 600.0000 mg | ORAL_TABLET | Freq: Four times a day (QID) | ORAL | 0 refills | Status: DC | PRN

## 2016-10-26 NOTE — ED Triage Notes (Addendum)
Patient states bilateral numbness/tingling started about one month ago. 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 Patient denies relation to hand injury. Patient states pain is in hands. Patient is in no apparent distress.

## 2016-10-26 NOTE — ED Notes (Signed)
Pt states x 1 month numbness to bilat hands. Pt denies injury. Alert, oriented, ambulatory. Is moving hands without difficulty.

## 2016-10-26 NOTE — ED Provider Notes (Signed)
[facility] Emergency Department Provider Note  ____________________________________________  Time seen: Approximately 12:00 PM  I have reviewed the triage vital signs and the nursing notes.   HISTORY  Chief Complaint Numbness    HPI Patrick PoundJustin A [name] is a 26 y.o. male that presents to the emergency department with bilateral hand numbness for 1 month. 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 He uses his hands and performs a lot of twisting motions with tools. Tingling wakes him up in the middle of the night. Symptoms are also worse when he gets off of work. No trauma. No alleviating measures have been attempted. He does not have diabetes. He denies chills, neck pain, shortness breath, chest pain, nausea, vomiting, abdominal pain, diarrhea, constipation.    No past medical history on file.  There are no active problems to display for this patient.   No past surgical history on file.  Prior to Admission medications   Medication Sig Start Date End Date Taking? Authorizing Provider  azithromycin (ZITHROMAX) 250 MG tablet Take 2 tablets by mouth on the first day. Take 1 tablet by mouth daily on days 2 through 5. 05/03/16   Orvil FeilWoods, Jaclyn M, PA-C  baclofen (LIORESAL) 10 MG tablet Take 1 tablet (10 mg total) by mouth 3 (three) times daily. 10/01/15   Beers, Charmayne Sheerharles M, PA-C  ibuprofen (ADVIL,MOTRIN) 600 MG tablet Take 1 tablet (600 mg total) by mouth every 6 (six) hours as needed. 10/26/16   Enid DerryWagner, Angeles Paolucci, PA-C  naproxen (NAPROSYN) 500 MG tablet Take 1 tablet (500 mg total) by mouth 2 (two) times daily with a meal. 10/01/15   Beers, Charmayne Sheerharles M, PA-C  ondansetron (ZOFRAN ODT) 4 MG disintegrating tablet Take 1 tablet (4 mg total) by mouth every 8 (eight) hours as needed for nausea or vomiting. 10/05/16   Minna AntisPaduchowski, Kevin, MD  predniSONE (DELTASONE) 10 MG tablet Take 6 tablets on day 1, take 5 tablets on day 2, take 4 tablets on day  3, take 3 tablets on day 4, take 2 tablets on day 5, take 1 tablet on day 6 10/26/16   Enid DerryWagner, Kynzlee Hucker, PA-C    Allergies Patient has no known allergies.  No family history on file.  Social History Social History  Substance Use Topics  . Smoking status: Current Every Day Smoker    Packs/day: 0.50    Types: Cigarettes  . Smokeless tobacco: Never Used  . Alcohol use Yes     Review of Systems  Constitutional: No fever/chills Cardiovascular: No chest pain. Respiratory: No SOB. Gastrointestinal: No abdominal pain.  No nausea, no vomiting.  Skin: Negative for rash, abrasions, lacerations, ecchymosis. Neurological: Negative for headaches   ____________________________________________   PHYSICAL EXAM:  VITAL SIGNS: ED Triage Vitals  Enc Vitals Group     BP 10/26/16 1111 (!) 163/106     Pulse Rate 10/26/16 1111 90     Resp 10/26/16 1111 16     Temp 10/26/16 1111 97.8 F (36.6 C)     Temp Source 10/26/16 1111 Oral     SpO2 10/26/16 1109 100 %     Weight 10/26/16 1112 300 lb (136.1 kg)     Height 10/26/16 1112 5\' 7"  (1.702 m)     Head Circumference --      Peak Flow --      Pain Score 10/26/16 1109 7     Pain Loc --      Pain Edu? --  Excl. in GC? --      Constitutional: Alert and oriented. Well appearing and in no acute distress. Eyes: Conjunctivae are normal. PERRL. EOMI. Head: Atraumatic. ENT:      Ears:      Nose: No congestion/rhinnorhea.      Mouth/Throat: Mucous membranes are moist.  Neck: No stridor.  No cervical spine tenderness to palpation. Cardiovascular: Normal rate, regular rhythm.  Good peripheral circulation. Respiratory: Normal respiratory effort without tachypnea or retractions. Lungs CTAB. Good air entry to the bases with no decreased or absent breath sounds. Musculoskeletal: Full range of motion to all extremities. No gross deformities appreciated. Positive Tinel's and Phalen's tests. Neurologic:  Normal speech and language. No gross focal  neurologic deficits are appreciated.  Skin:  Skin is warm, dry and intact. No rash noted.    ____________________________________________   LABS (all labs ordered are listed, but only abnormal results are displayed)  Labs Reviewed - No data to display ____________________________________________  EKG   ____________________________________________  RADIOLOGY   No results found.  ____________________________________________    PROCEDURES  Procedure(s) performed:    Procedures    Medications - No data to display   ____________________________________________   INITIAL IMPRESSION / ASSESSMENT AND PLAN / ED COURSE  Pertinent labs & imaging results that were available during my care of the patient were reviewed by me and considered in my medical decision making (see chart for details).  Review of the [place] CSRS was performed in accordance of the [facility] prior to dispensing any controlled drugs.   Patient's diagnosis is consistent with carpal tunnel. Vital signs and exam are reassuring. Patient does not want a Toradol or solumedrol injection. Patient will be discharged home with prescriptions for prednisone and ibuprofen. Patient is to follow up with hand specialist as directed. Patient is given ED precautions to return to the ED for any worsening or new symptoms.     ____________________________________________  FINAL CLINICAL IMPRESSION(S) / ED DIAGNOSES  Final diagnoses:  Bilateral carpal tunnel syndrome      NEW MEDICATIONS STARTED DURING THIS VISIT:  Discharge Medication List as of 10/26/2016 12:20 PM    START taking these medications   Details  ibuprofen (ADVIL,MOTRIN) 600 MG tablet Take 1 tablet (600 mg total) by mouth every 6 (six) hours as needed., Starting Thu 10/26/2016, Print    predniSONE (DELTASONE) 10 MG tablet Take 6 tablets on day 1, take 5 tablets on day 2, take 4 tablets on day 3, take 3 tablets on day 4, take 2 tablets on day 5, take 1 tablet  on day 6, Print            This chart was dictated using voice recognition software/Dragon. Despite best efforts to proofread, errors can occur which can change the meaning. Any change was purely unintentional.    Enid Derry, PA-C 10/26/16 1401    Emily Filbert, MD 10/26/16 1450

## 2017-06-08 ENCOUNTER — Encounter: Payer: Self-pay | Admitting: Emergency Medicine

## 2017-06-08 ENCOUNTER — Other Ambulatory Visit: Payer: Self-pay

## 2017-06-08 ENCOUNTER — Emergency Department
Admission: EM | Admit: 2017-06-08 | Discharge: 2017-06-08 | Disposition: A | Discharge status: Home / Self Care | Payer: Self-pay | Attending: Emergency Medicine | Admitting: Emergency Medicine

## 2017-06-08 ENCOUNTER — Emergency Department: Payer: Self-pay

## 2017-06-08 DIAGNOSIS — F17210 Nicotine dependence, cigarettes, uncomplicated: Secondary | ICD-10-CM | POA: Insufficient documentation

## 2017-06-08 DIAGNOSIS — R1031 Right lower quadrant pain: Secondary | ICD-10-CM | POA: Insufficient documentation

## 2017-06-08 DIAGNOSIS — Z79899 Other long term (current) drug therapy: Secondary | ICD-10-CM | POA: Insufficient documentation

## 2017-06-08 DIAGNOSIS — R109 Unspecified abdominal pain: Secondary | ICD-10-CM

## 2017-06-08 LAB — URINALYSIS, COMPLETE (UACMP) WITH MICROSCOPIC
Bilirubin Urine: NEGATIVE
Glucose, UA: NEGATIVE mg/dL
Ketones, ur: NEGATIVE mg/dL
Leukocytes, UA: NEGATIVE
Nitrite: NEGATIVE
Protein, ur: NEGATIVE mg/dL
Specific Gravity, Urine: 1.014 (ref 1.005–1.030)
pH: 6 (ref 5.0–8.0)

## 2017-06-08 LAB — COMPREHENSIVE METABOLIC PANEL
ALT: 20 U/L (ref 17–63)
ANION GAP: 5 (ref 5–15)
AST: 19 U/L (ref 15–41)
Albumin: 3.8 g/dL (ref 3.5–5.0)
Alkaline Phosphatase: 59 U/L (ref 38–126)
BUN: 13 mg/dL (ref 6–20)
CHLORIDE: 105 mmol/L (ref 101–111)
CO2: 28 mmol/L (ref 22–32)
Calcium: 8.9 mg/dL (ref 8.9–10.3)
Creatinine, Ser: 0.96 mg/dL (ref 0.61–1.24)
GFR calc Af Amer: >60 mL/min (ref >60)
Glucose, Bld: 95 mg/dL (ref 65–99)
POTASSIUM: 4 mmol/L (ref 3.5–5.1)
Sodium: 138 mmol/L (ref 135–145)
Total Bilirubin: 0.4 mg/dL (ref 0.3–1.2)
Total Protein: 7.4 g/dL (ref 6.5–8.1)

## 2017-06-08 LAB — CBC WITH DIFFERENTIAL/PLATELET
Basophils Absolute: 0 K/uL (ref 0–0.1)
Basophils Relative: 1 %
Eosinophils Absolute: 0.3 K/uL (ref 0–0.7)
Eosinophils Relative: 5 %
HCT: 40.5 % (ref 40.0–52.0)
Hemoglobin: 13.6 g/dL (ref 13.0–18.0)
Lymphocytes Relative: 24 %
Lymphs Abs: 1.6 K/uL (ref 1.0–3.6)
MCH: 28.6 pg (ref 26.0–34.0)
MCHC: 33.5 g/dL (ref 32.0–36.0)
MCV: 85.5 fL (ref 80.0–100.0)
Monocytes Absolute: 0.5 K/uL (ref 0.2–1.0)
Monocytes Relative: 7 %
Neutro Abs: 4.1 K/uL (ref 1.4–6.5)
Neutrophils Relative %: 63 %
Platelets: 244 K/uL (ref 150–440)
RBC: 4.74 MIL/uL (ref 4.40–5.90)
RDW: 13.7 % (ref 11.5–14.5)
WBC: 6.5 K/uL (ref 3.8–10.6)

## 2017-06-08 LAB — CHLAMYDIA/NGC RT PCR (ARMC ONLY)
CHLAMYDIA TR: NOT DETECTED
N gonorrhoeae: NOT DETECTED

## 2017-06-08 MED ORDER — NAPROXEN 500 MG PO TABS: 500.0000 mg | ORAL_TABLET | Freq: Two times a day (BID) | ORAL | 0 refills | Status: DC

## 2017-06-08 MED ORDER — CYCLOBENZAPRINE HCL 10 MG PO TABS: 10.0000 mg | ORAL_TABLET | Freq: Three times a day (TID) | ORAL | 0 refills | Status: DC | PRN

## 2017-06-08 NOTE — ED Notes (Signed)
Pt ambulatory upon discharge. Verbalized understanding of discharge instructions, follow-up care and prescriptions. VSS. Skin warm and dry. A&O x4.  

## 2017-06-08 NOTE — ED Notes (Signed)
Patient transported to CT 

## 2017-06-08 NOTE — ED Triage Notes (Signed)
Low back pain, cough, lung pain with inspiration.  Symptoms have been ongoing x 4 days.  Also reports increased sneezing yesterday.

## 2017-06-08 NOTE — ED Provider Notes (Signed)
[facility] Emergency Department Provider Note ___________________________________________   None    (approximate)  I have reviewed the triage vital signs and the nursing notes.   HISTORY  Chief Complaint Back Pain and sneezing  HPI  JA OHMAN is a 27 y.o. male who presents to the emergency department for evaluation and treatment of right flank pain.He states that he has also had dark urine and an increase in urination and feeling of retention after urination. He denies penile discharge. He has had a cough as well for the past 4 days. He has not taken any medications for these symptoms.  History reviewed. No pertinent past medical history.  There are no active problems to display for this patient.   History reviewed. No pertinent surgical history.  Prior to Admission medications   Medication Sig Start Date End Date Taking? Authorizing Provider  azithromycin (ZITHROMAX) 250 MG tablet Take 2 tablets by mouth on the first day. Take 1 tablet by mouth daily on days 2 through 5. 05/03/16   Orvil Feil, PA-C  baclofen (LIORESAL) 10 MG tablet Take 1 tablet (10 mg total) by mouth 3 (three) times daily. 10/01/15   Beers, Charmayne Sheer, PA-C  cyclobenzaprine (FLEXERIL) 10 MG tablet Take 1 tablet (10 mg total) by mouth 3 (three) times daily as needed for muscle spasms. 06/08/17   Kenyona Rena, Rulon Eisenmenger B, FNP  ibuprofen (ADVIL,MOTRIN) 600 MG tablet Take 1 tablet (600 mg total) by mouth every 6 (six) hours as needed. 10/26/16   Enid Derry, PA-C  naproxen (NAPROSYN) 500 MG tablet Take 1 tablet (500 mg total) by mouth 2 (two) times daily with a meal. 06/08/17   Aryel Edelen B, FNP  ondansetron (ZOFRAN ODT) 4 MG disintegrating tablet Take 1 tablet (4 mg total) by mouth every 8 (eight) hours as needed for nausea or vomiting. 10/05/16   Minna Antis, MD  predniSONE (DELTASONE) 10 MG tablet Take 6 tablets on day 1, take 5 tablets on day 2, take 4 tablets on day 3, take 3  tablets on day 4, take 2 tablets on day 5, take 1 tablet on day 6 10/26/16   Enid Derry, PA-C    Allergies Patient has no known allergies.  No family history on file.  Social History Social History   Tobacco Use  . Smoking status: Current Every Day Smoker    Packs/day: 0.50    Types: Cigarettes  . Smokeless tobacco: Never Used  Substance Use Topics  . Alcohol use: Yes  . Drug use: Not on file    Review of Systems  Constitutional: No fever/chills Eyes: No visual changes. ENT: No sore throat. Positive for sneezing. Cardiovascular: Denies chest pain. Respiratory: Denies shortness of breath. Gastrointestinal: No abdominal pain.  No nausea, no vomiting.  No diarrhea.  No constipation. Genitourinary: Negative for dysuria. Positive for dark urination. Musculoskeletal: Positive for back pain. Skin: Negative for rash. Neurological: Negative for headaches, focal weakness or numbness. ___________________________________________   PHYSICAL EXAM:  VITAL SIGNS: ED Triage Vitals  Enc Vitals Group     BP 06/08/17 1426 (!) 154/102     Pulse Rate 06/08/17 1426 79     Resp 06/08/17 1426 16     Temp 06/08/17 1426 98.5 F (36.9 C)     Temp Source 06/08/17 1426 Oral     SpO2 06/08/17 1426 98 %     Weight 06/08/17 1425 240 lb (108.9 kg)     Height 06/08/17 1425 5\' 7"  (1.702 m)  Head Circumference --      Peak Flow --      Pain Score 06/08/17 1425 8     Pain Loc --      Pain Edu? --      Excl. in GC? --     Constitutional: Alert and oriented. Well appearing and in no acute distress. Eyes: Conjunctivae are normal.  Head: Atraumatic. Nose: No congestion/rhinnorhea. Mouth/Throat: Mucous membranes are moist.  Oropharynx non-erythematous. Neck: No stridor.   Cardiovascular: Normal rate, regular rhythm. Grossly normal heart sounds.  Good peripheral circulation. Respiratory: Normal respiratory effort.  No retractions. Lungs CTAB. Gastrointestinal: Soft and nontender. No  distention. No abdominal bruits. No CVA tenderness. Musculoskeletal: No lower extremity tenderness nor edema.  No joint effusions. Neurologic:  Normal speech and language. No gross focal neurologic deficits are appreciated. No gait instability. Skin:  Skin is warm, dry and intact. No rash noted. Psychiatric: Mood and affect are normal. Speech and behavior are normal.  ____________________________________________   LABS (all labs ordered are listed, but only abnormal results are displayed)  Labs Reviewed  URINALYSIS, COMPLETE (UACMP) WITH MICROSCOPIC - Abnormal; Notable for the following components:      Result Value   Color, Urine YELLOW (*)    APPearance CLEAR (*)    Hgb urine dipstick LARGE (*)    Squamous Epithelial / LPF 0-5 (*)    All other components within normal limits  CHLAMYDIA/NGC RT PCR ([facility] ONLY)  CBC WITH DIFFERENTIAL/PLATELET  COMPREHENSIVE METABOLIC PANEL   ____________________________________________  EKG  Not indicated. ____________________________________________  RADIOLOGY  ED MD interpretation: No acute bony abnormality of the lumbar spine.  Official radiology report(s): Ct Renal Stone Study  Result Date: 06/08/2017 CLINICAL DATA:  RIGHT flank pain with dark urine for 4 days, suspected stone disease EXAM: CT ABDOMEN AND PELVIS WITHOUT CONTRAST TECHNIQUE: Multidetector CT imaging of the abdomen and pelvis was performed following the standard protocol without IV contrast. Sagittal and coronal MPR images reconstructed from axial data set. No oral contrast was administered. COMPARISON:  None FINDINGS: Lower chest: Lung bases clear Hepatobiliary: Gallbladder and liver normal appearance Pancreas: Normal appearance Spleen: Normal appearance Adrenals/Urinary Tract: Adrenal glands, kidneys, ureters, and bladder normal appearance. No urinary tract calcification or dilatation. Stomach/Bowel: Normal appendix. Stomach and bowel loops normal appearance.  Vascular/Lymphatic: Vascular structures unremarkable. No adenopathy. Reproductive: Unremarkable Other: No free air or free fluid. No hernia or acute inflammatory process. Musculoskeletal: Normal appearance IMPRESSION: Normal exam. Electronically Signed   By: Ulyses SouthwardMark  Boles M.D.   On: 06/08/2017 16:04    ____________________________________________   PROCEDURES  Procedure(s) performed: None  Procedures  Critical Care performed: No  ____________________________________________   INITIAL IMPRESSION / ASSESSMENT AND PLAN / ED COURSE  As part of my medical decision making, I reviewed the following data within the electronic MEDICAL RECORD NUMBER Nursing notes reviewed and incorporated   27 year old male presenting to the ER for flank pain. Images, labs, and exam are reassuring. He is to see his PCP as scheduled on Monday for a repeat urinalysis. Symptoms most likely secondary to musculoskeletal pain due to lifting at his job. He will be treated with flexeril and naprosyn and advised to return to the ER for symptoms of concern if unable to schedule an appointment.  Clinical Course as of Jun 09 1839  Fri Jun 08, 2017  1630 Hgb urine dipstick(!): LARGE [DK]    Clinical Course User Index [DK] Max SaneKamtarin, Deeyon F, Student-PA     ____________________________________________  FINAL CLINICAL IMPRESSION(S) / ED DIAGNOSES  Final diagnoses:  Acute right flank pain     ED Discharge Orders        Ordered    cyclobenzaprine (FLEXERIL) 10 MG tablet  3 times daily PRN     06/08/17 1641    naproxen (NAPROSYN) 500 MG tablet  2 times daily with meals     06/08/17 1641       Note:  This document was prepared using Dragon voice recognition software and may include unintentional dictation errors.    Chinita Pester, FNP 06/08/17 1610    Minna Antis, MD 06/08/17 2015

## 2017-06-08 NOTE — ED Notes (Signed)
Pt attempting to obtain urine specimen at this time.

## 2017-07-15 ENCOUNTER — Emergency Department
Admission: EM | Admit: 2017-07-15 | Discharge: 2017-07-15 | Disposition: A | Discharge status: Home / Self Care | Payer: Self-pay | Attending: Emergency Medicine | Admitting: Emergency Medicine

## 2017-07-15 ENCOUNTER — Other Ambulatory Visit: Payer: Self-pay

## 2017-07-15 ENCOUNTER — Encounter: Payer: Self-pay | Admitting: Emergency Medicine

## 2017-07-15 DIAGNOSIS — F17210 Nicotine dependence, cigarettes, uncomplicated: Secondary | ICD-10-CM | POA: Insufficient documentation

## 2017-07-15 DIAGNOSIS — R319 Hematuria, unspecified: Secondary | ICD-10-CM | POA: Insufficient documentation

## 2017-07-15 DIAGNOSIS — Z79899 Other long term (current) drug therapy: Secondary | ICD-10-CM | POA: Insufficient documentation

## 2017-07-15 DIAGNOSIS — M545 Low back pain, unspecified: Secondary | ICD-10-CM

## 2017-07-15 LAB — BASIC METABOLIC PANEL
Anion gap: 5 (ref 5–15)
BUN: 15 mg/dL (ref 6–20)
CHLORIDE: 106 mmol/L (ref 101–111)
CO2: 28 mmol/L (ref 22–32)
CREATININE: 1.03 mg/dL (ref 0.61–1.24)
Calcium: 9 mg/dL (ref 8.9–10.3)
GFR calc Af Amer: >60 mL/min (ref >60)
GFR calc non Af Amer: >60 mL/min (ref >60)
Glucose, Bld: 101 mg/dL — ABNORMAL HIGH (ref 65–99)
Potassium: 4 mmol/L (ref 3.5–5.1)
Sodium: 139 mmol/L (ref 135–145)

## 2017-07-15 LAB — URINALYSIS, COMPLETE (UACMP) WITH MICROSCOPIC
BILIRUBIN URINE: NEGATIVE
Bacteria, UA: NONE SEEN
GLUCOSE, UA: NEGATIVE mg/dL
Ketones, ur: NEGATIVE mg/dL
LEUKOCYTES UA: NEGATIVE
NITRITE: NEGATIVE
PH: 5 (ref 5.0–8.0)
Protein, ur: NEGATIVE mg/dL
SPECIFIC GRAVITY, URINE: 1.018 (ref 1.005–1.030)

## 2017-07-15 LAB — CBC
HCT: 42.1 % (ref 40.0–52.0)
Hemoglobin: 13.9 g/dL (ref 13.0–18.0)
MCH: 28.1 pg (ref 26.0–34.0)
MCHC: 33.1 g/dL (ref 32.0–36.0)
MCV: 84.7 fL (ref 80.0–100.0)
Platelets: 242 10*3/uL (ref 150–440)
RBC: 4.97 MIL/uL (ref 4.40–5.90)
RDW: 13.6 % (ref 11.5–14.5)
WBC: 5.3 10*3/uL (ref 3.8–10.6)

## 2017-07-15 LAB — CK: CK TOTAL: 158 U/L (ref 49–397)

## 2017-07-15 MED ORDER — IBUPROFEN 600 MG PO TABS: 600.0000 mg | ORAL_TABLET | Freq: Four times a day (QID) | ORAL | 0 refills | Status: DC | PRN

## 2017-07-15 MED ORDER — HYDROCHLOROTHIAZIDE 25 MG PO TABS
25.0000 mg | ORAL_TABLET | Freq: Every day | ORAL | 1 refills | Status: DC
Start: 2017-07-15 — End: 2018-06-05

## 2017-07-15 MED ORDER — SULFAMETHOXAZOLE-TRIMETHOPRIM 800-160 MG PO TABS: 1.0000 | ORAL_TABLET | Freq: Two times a day (BID) | ORAL | 0 refills | Status: DC

## 2017-07-15 NOTE — ED Provider Notes (Signed)
[facility] Emergency Department Provider Note  Time seen: 3:32 PM  I have reviewed the triage vital signs and the nursing notes.   HISTORY  Chief Complaint Back Pain    HPI Patrick [name] is a 27 y.o. male with no significant past medical history presents to the emergency department for right back pain and dark urine.  According to the patient approximately 1 month ago he was seen in the emergency department after a fall.  At that time he was noted to have a small amount of blood in his urine, but otherwise normal work-up.  Patient was discharged on muscle relaxers as well as antibiotics.  Patient states the pain in the dark urine went away however several days ago the pain has returned once again to his right lower back and he states his urine has been appearing more dark.  Denies any history of kidney stones.  Patient does state a history of kidney disease, mom is on dialysis.  Patient denies any fever at home states the pain is mild but he was concerned so he wanted to get it evaluated.   History reviewed. No pertinent past medical history.  There are no active problems to display for this patient.   History reviewed. No pertinent surgical history.  Prior to Admission medications   Medication Sig Start Date End Date Taking? Authorizing Provider  azithromycin (ZITHROMAX) 250 MG tablet Take 2 tablets by mouth on the first day. Take 1 tablet by mouth daily on days 2 through 5. 05/03/16   Orvil Feil, PA-C  baclofen (LIORESAL) 10 MG tablet Take 1 tablet (10 mg total) by mouth 3 (three) times daily. 10/01/15   Beers, Charmayne Sheer, PA-C  cyclobenzaprine (FLEXERIL) 10 MG tablet Take 1 tablet (10 mg total) by mouth 3 (three) times daily as needed for muscle spasms. 06/08/17   Triplett, Rulon Eisenmenger B, FNP  ibuprofen (ADVIL,MOTRIN) 600 MG tablet Take 1 tablet (600 mg total) by mouth every 6 (six) hours as needed. 10/26/16   Enid Derry, PA-C  naproxen (NAPROSYN) 500 MG tablet  Take 1 tablet (500 mg total) by mouth 2 (two) times daily with a meal. 06/08/17   Triplett, Cari B, FNP  ondansetron (ZOFRAN ODT) 4 MG disintegrating tablet Take 1 tablet (4 mg total) by mouth every 8 (eight) hours as needed for nausea or vomiting. 10/05/16   Minna Antis, MD  predniSONE (DELTASONE) 10 MG tablet Take 6 tablets on day 1, take 5 tablets on day 2, take 4 tablets on day 3, take 3 tablets on day 4, take 2 tablets on day 5, take 1 tablet on day 6 10/26/16   Enid Derry, PA-C    No Known Allergies  No family history on file.  Social History Social History   Tobacco Use  . Smoking status: Current Every Day Smoker    Packs/day: 0.50    Types: Cigarettes  . Smokeless tobacco: Never Used  Substance Use Topics  . Alcohol use: Yes  . Drug use: Not Currently    Review of Systems Constitutional: Negative for fever. Eyes: Negative for visual complaints ENT: Negative for recent illness/congestion Cardiovascular: Negative for chest pain. Respiratory: Negative for shortness of breath. Gastrointestinal: Negative for abdominal pain.  Negative nausea vomiting diarrhea. Genitourinary: Darker urine. Musculoskeletal: Mild right lower back pain.  Dull. Skin: Negative for skin complaints  Neurological: Negative for headache All other ROS negative  ____________________________________________   PHYSICAL EXAM:  VITAL SIGNS: ED Triage Vitals  Enc  Vitals Group     BP 07/15/17 1514 (!) 160/115     Pulse Rate 07/15/17 1514 91     Resp 07/15/17 1514 20     Temp 07/15/17 1514 98.8 F (37.1 C)     Temp Source 07/15/17 1514 Oral     SpO2 07/15/17 1514 96 %     Weight 07/15/17 1514 240 lb (108.9 kg)     Height 07/15/17 1514  (1.702 m)     Head Circumference --      Peak Flow --      Pain Score 07/15/17 1518 6     Pain Loc --      Pain Edu? --      Excl. in GC? --     Constitutional: Alert and oriented. Well appearing and in no distress. Eyes: Normal exam ENT    Head: Normocephalic and atraumatic.   Mouth/Throat: Mucous membranes are moist. Cardiovascular: Normal rate, regular rhythm. No murmur Respiratory: Normal respiratory effort without tachypnea nor retractions. Breath sounds are clear Gastrointestinal: Soft and nontender. No distention.  Musculoskeletal: Mild right lower back tenderness to palpation.  No CVA tenderness. Neurologic:  Normal speech and language. No gross focal neurologic deficits  Skin:  Skin is warm, dry and intact.  Psychiatric: Mood and affect are normal.   ____________________________________________    INITIAL IMPRESSION / ASSESSMENT AND PLAN / ED COURSE  Pertinent labs & imaging results that were available during my care of the patient were reviewed by me and considered in my medical decision making (see chart for details).  Patient presents to the emergency department for right lower back pain and darker appearing urine.  I reviewed the patient's records from 06/08/2017 the patient was seen after a fall with right lower back pain and dark urine.  At that time the patient had 6-30 red blood cells in his urinalysis otherwise negative.  CT scan of the abdomen and pelvis was negative for kidney stone.  We will recheck labs including a CBC, BMP, CK and urinalysis.  Overall the patient appears very well, no distress very mild tenderness to palpation in the right lower back appears to be lower than the kidney with no CVA tenderness to palpation.  Patient's labs show moderate amount of blood, no other findings.  Kidney function is normal.  Given the patient's continued blood in his urine we will refer to urology for further evaluation.  I will send a urine culture, but hold off on antibiotics at this time.  Patient agreeable to this plan of care.  ____________________________________________   FINAL CLINICAL IMPRESSION(S) / ED DIAGNOSES  Right lower back pain Hematuria    Minna Antis, MD 07/15/17 1625

## 2017-07-15 NOTE — ED Triage Notes (Signed)
Pt comes into the ED via POV c/o right lower back pain that started a week ago.  Patient has been seen here twice for this problem.  Initial problem was he fell on that area and the contusion was over his kidneys causing blood to be in his urine.  Patient states the pain has not gotten any better at this time.  Patient in NAD with even and unlabored respirations.

## 2017-07-15 NOTE — Discharge Instructions (Addendum)
Please call the number provided to follow-up with urologist as soon as possible.  Return to the emergency department for any worsening pain, fever, or pain with urination.

## 2017-07-16 LAB — URINE CULTURE: Culture: NO GROWTH

## 2017-08-25 ENCOUNTER — Emergency Department
Admission: EM | Admit: 2017-08-25 | Discharge: 2017-08-25 | Disposition: A | Discharge status: Home / Self Care | Payer: Self-pay | Attending: Emergency Medicine | Admitting: Emergency Medicine

## 2017-08-25 ENCOUNTER — Encounter: Payer: Self-pay | Admitting: Emergency Medicine

## 2017-08-25 ENCOUNTER — Other Ambulatory Visit: Payer: Self-pay

## 2017-08-25 DIAGNOSIS — M545 Low back pain, unspecified: Secondary | ICD-10-CM

## 2017-08-25 DIAGNOSIS — W2209XA Striking against other stationary object, initial encounter: Secondary | ICD-10-CM | POA: Insufficient documentation

## 2017-08-25 DIAGNOSIS — R3121 Asymptomatic microscopic hematuria: Secondary | ICD-10-CM | POA: Insufficient documentation

## 2017-08-25 DIAGNOSIS — Z79899 Other long term (current) drug therapy: Secondary | ICD-10-CM | POA: Insufficient documentation

## 2017-08-25 DIAGNOSIS — F17210 Nicotine dependence, cigarettes, uncomplicated: Secondary | ICD-10-CM | POA: Insufficient documentation

## 2017-08-25 LAB — URINALYSIS, COMPLETE (UACMP) WITH MICROSCOPIC
Bacteria, UA: NONE SEEN
Bilirubin Urine: NEGATIVE
GLUCOSE, UA: NEGATIVE mg/dL
Ketones, ur: NEGATIVE mg/dL
LEUKOCYTES UA: NEGATIVE
Nitrite: NEGATIVE
PH: 5 (ref 5.0–8.0)
Protein, ur: NEGATIVE mg/dL
SPECIFIC GRAVITY, URINE: 1.015 (ref 1.005–1.030)

## 2017-08-25 LAB — COMPREHENSIVE METABOLIC PANEL
ALK PHOS: 54 U/L (ref 38–126)
ALT: 21 U/L (ref 0–44)
AST: 22 U/L (ref 15–41)
Albumin: 3.9 g/dL (ref 3.5–5.0)
Anion gap: 6 (ref 5–15)
BUN: 15 mg/dL (ref 6–20)
CALCIUM: 8.9 mg/dL (ref 8.9–10.3)
CO2: 27 mmol/L (ref 22–32)
CREATININE: 1.05 mg/dL (ref 0.61–1.24)
Chloride: 107 mmol/L (ref 98–111)
Glucose, Bld: 103 mg/dL — ABNORMAL HIGH (ref 70–99)
Potassium: 3.7 mmol/L (ref 3.5–5.1)
Sodium: 140 mmol/L (ref 135–145)
Total Bilirubin: 0.7 mg/dL (ref 0.3–1.2)
Total Protein: 7.7 g/dL (ref 6.5–8.1)

## 2017-08-25 LAB — CBC
HCT: 40 % (ref 40.0–52.0)
HEMOGLOBIN: 13.5 g/dL (ref 13.0–18.0)
MCH: 28.6 pg (ref 26.0–34.0)
MCHC: 33.8 g/dL (ref 32.0–36.0)
MCV: 84.7 fL (ref 80.0–100.0)
Platelets: 254 10*3/uL (ref 150–440)
RBC: 4.72 MIL/uL (ref 4.40–5.90)
RDW: 13.7 % (ref 11.5–14.5)
WBC: 5.7 10*3/uL (ref 3.8–10.6)

## 2017-08-25 LAB — CK: CK TOTAL: 201 U/L (ref 49–397)

## 2017-08-25 MED ORDER — IBUPROFEN 800 MG PO TABS: 800.0000 mg | ORAL_TABLET | Freq: Three times a day (TID) | ORAL | 0 refills | Status: DC | PRN

## 2017-08-25 MED ORDER — CYCLOBENZAPRINE HCL 5 MG PO TABS: ORAL_TABLET | ORAL | 0 refills | Status: DC

## 2017-08-25 MED ORDER — LIDOCAINE 5 % EX PTCH: 1.0000 | MEDICATED_PATCH | Freq: Two times a day (BID) | CUTANEOUS | 0 refills | Status: DC

## 2017-08-25 NOTE — Discharge Instructions (Addendum)
Your exam and labs are normal at this time. Follow-up with Dr. Linzie CollinHerick as discussed. Take the prescription meds as directed.

## 2017-08-25 NOTE — ED Triage Notes (Signed)
Low back pain since July 4th.  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  Missed work yesterday due to pain.

## 2017-08-25 NOTE — ED Notes (Signed)
Pt reports constant heavy lifting with twisting and turning at work. Pain in right side of lower back.   Came in several months ago s/p falling at his house. Thinks this is a flare up of that injury d/t being very busy at work. No prior injury before fall that brought him in here.   Reports calling out of work yesterday d/t pain. Employer suggested he come here today instead of coming into work today.

## 2017-08-25 NOTE — ED Notes (Signed)
Pt ambulatory upon discharge; declined wheel chair. Verbalized understanding of discharge instructions, follow-up care and prescriptions. VSS. Skin warm and dry. A&O x4.  

## 2017-08-25 NOTE — ED Provider Notes (Signed)
[facility] Emergency Department Provider Note  ____________________________________________  Time seen: Approximately 2:56 PM  I have reviewed the triage vital signs and the nursing notes.   HISTORY  Chief Complaint Back Pain    HPI Patrick [name] is a 27 y.o. male that presents emergency department for evaluation of right sided back pain for 2 days.  Patient states that he hit the side of his back on a door take 2 days ago, when back pain started.  Back pain does not radiate.  He lifts heavy items at work.  No bowel or bladder dysfunction or saddle paresthesias.  He finished a course of Bactrim about 1 month ago for possible urinary tract infection.  He never followed up with urology.  He states that back pain is different than at that time and this feels like it is in the muscles.  No fever, chills, dysuria, urgency, frequency, numbness, tingling.   History reviewed. No pertinent past medical history.  There are no active problems to display for this patient.   History reviewed. No pertinent surgical history.  Prior to Admission medications   Medication Sig Start Date End Date Taking? Authorizing Provider  azithromycin (ZITHROMAX) 250 MG tablet Take 2 tablets by mouth on the first day. Take 1 tablet by mouth daily on days 2 through 5. 05/03/16   Orvil Feil, PA-C  baclofen (LIORESAL) 10 MG tablet Take 1 tablet (10 mg total) by mouth 3 (three) times daily. 10/01/15   Beers, Charmayne Sheer, PA-C  cyclobenzaprine (FLEXERIL) 5 MG tablet Take 1-2 tablets 3 times daily as needed 08/25/17   Enid Derry, PA-C  hydrochlorothiazide (HYDRODIURIL) 25 MG tablet Take 1 tablet (25 mg total) by mouth daily. 07/15/17   Minna Antis, MD  ibuprofen (ADVIL,MOTRIN) 800 MG tablet Take 1 tablet (800 mg total) by mouth every 8 (eight) hours as needed. 08/25/17   Enid Derry, PA-C  lidocaine (LIDODERM) 5 % Place 1 patch onto the skin every 12 (twelve) hours. Remove & Discard  patch within 12 hours or as directed by MD 08/25/17 08/25/18  Enid Derry, PA-C  naproxen (NAPROSYN) 500 MG tablet Take 1 tablet (500 mg total) by mouth 2 (two) times daily with a meal. 06/08/17   Triplett, Cari B, FNP  ondansetron (ZOFRAN ODT) 4 MG disintegrating tablet Take 1 tablet (4 mg total) by mouth every 8 (eight) hours as needed for nausea or vomiting. 10/05/16   Minna Antis, MD  predniSONE (DELTASONE) 10 MG tablet Take 6 tablets on day 1, take 5 tablets on day 2, take 4 tablets on day 3, take 3 tablets on day 4, take 2 tablets on day 5, take 1 tablet on day 6 10/26/16   Enid Derry, PA-C  sulfamethoxazole-trimethoprim (BACTRIM DS,SEPTRA DS) 800-160 MG tablet Take 1 tablet by mouth 2 (two) times daily. 07/15/17   Minna Antis, MD    Allergies Patient has no known allergies.  No family history on file.  Social History Social History   Tobacco Use  . Smoking status: Current Every Day Smoker    Packs/day: 0.50    Types: Cigarettes  . Smokeless tobacco: Never Used  Substance Use Topics  . Alcohol use: Yes  . Drug use: Not Currently     Review of Systems  Constitutional: No fever/chills Cardiovascular: No chest pain. Respiratory: No SOB. Gastrointestinal: No abdominal pain.  No nausea, no vomiting.  Genitourinary: Negative for dysuria, urgency, frequency, hematuria.  Musculoskeletal: Positive for back pain.  Skin: Negative  for rash, abrasions, lacerations, ecchymosis. Neurological: Negative for numbness or tingling   ____________________________________________   PHYSICAL EXAM:  VITAL SIGNS: ED Triage Vitals  Enc Vitals Group     BP 08/25/17 1422 (!) 147/87     Pulse Rate 08/25/17 1422 94     Resp 08/25/17 1422 16     Temp 08/25/17 1422 98.2 F (36.8 C)     Temp Source 08/25/17 1422 Oral     SpO2 08/25/17 1422 98 %     Weight 08/25/17 1421 284 lb (128.8 kg)     Height 08/25/17 1421 5\' 7"  (1.702 m)     Head Circumference --      Peak Flow --       Pain Score 08/25/17 1420 7     Pain Loc --      Pain Edu? --      Excl. in GC? --      Constitutional: Alert and oriented. Well appearing and in no acute distress. Eyes: Conjunctivae are normal. PERRL. EOMI. Head: Atraumatic. ENT:      Ears:      Nose: No congestion/rhinnorhea.      Mouth/Throat: Mucous membranes are moist.  Neck: No stridor. Cardiovascular: Normal rate, regular rhythm.  Good peripheral circulation. Respiratory: Normal respiratory effort without tachypnea or retractions. Lungs CTAB. Good air entry to the bases with no decreased or absent breath sounds. Gastrointestinal: Bowel sounds 4 quadrants. Soft and nontender to palpation. No guarding or rigidity. No palpable masses. No distention. No CVA tenderness. Musculoskeletal: Full range of motion to all extremities. No gross deformities appreciated.  Tenderness to palpation over right  lumbar paraspinal muscles.  Normal gait.  Strength and sensation equal in lower extremity's bilaterally. Neurologic:  Normal speech and language. No gross focal neurologic deficits are appreciated.  Skin:  Skin is warm, dry and intact. No rash noted. Psychiatric: Mood and affect are normal. Speech and behavior are normal. Patient exhibits appropriate insight and judgement.   ____________________________________________   LABS (all labs ordered are listed, but only abnormal results are displayed)  Labs Reviewed  URINALYSIS, COMPLETE (UACMP) WITH MICROSCOPIC - Abnormal; Notable for the following components:      Result Value   Color, Urine YELLOW (*)    APPearance CLEAR (*)    Hgb urine dipstick LARGE (*)    All other components within normal limits  CBC  COMPREHENSIVE METABOLIC PANEL  CK   ____________________________________________  EKG   ____________________________________________  RADIOLOGY   No results found.  ____________________________________________    PROCEDURES  Procedure(s) performed:     Procedures    Medications - No data to display   ____________________________________________   INITIAL IMPRESSION / ASSESSMENT AND PLAN / ED COURSE  Pertinent labs & imaging results that were available during my care of the patient were reviewed by me and considered in my medical decision making (see chart for details).  Review of the [place] CSRS was performed in accordance of the [facility] prior to dispensing any controlled drugs.   Patient's diagnosis is consistent with muscle strain.  Vital signs and exam are reassuring.  Patient's labs show a moderate amount of blood.  Labs were ordered to evaluate kidney function.  Symptoms have been ongoing from April.  CT at this time was negative for stone.  Tenderness to palpation is lower than kidneys.  Patient was strongly encouraged to follow-up with urology.  [facility] while awaiting lab work.  Plan will be to discharge home  with prescriptions for flexeril, ibuprofen. Patient is to follow up with urology and PCP. Patient is given ED precautions to return to the ED for any worsening or new symptoms.     ____________________________________________  FINAL CLINICAL IMPRESSION(S) / ED DIAGNOSES  Final diagnoses:  Acute right-sided low back pain without sciatica  Asymptomatic microscopic hematuria      NEW MEDICATIONS STARTED DURING THIS VISIT:  ED Discharge Orders        Ordered    cyclobenzaprine (FLEXERIL) 5 MG tablet     08/25/17 1543    ibuprofen (ADVIL,MOTRIN) 800 MG tablet  Every 8 hours PRN     08/25/17 1543    lidocaine (LIDODERM) 5 %  Every 12 hours     08/25/17 1543          This chart was dictated using voice recognition software/Dragon. Despite best efforts to proofread, errors can occur which can change the meaning. Any change was purely unintentional.    Enid Derry, PA-C 08/25/17 1559    Pershing Proud Myra Rude, MD 08/28/17 2142

## 2017-08-25 NOTE — ED Triage Notes (Signed)
Lower back pain 

## 2017-08-25 NOTE — ED Provider Notes (Signed)
-----------------------------------------   4:15 PM on 08/25/2017 -----------------------------------------   Blood pressure (!) 147/87, pulse 94, temperature 98.2 F (36.8 C), temperature source Oral, resp. rate 16, height 5\' 7"  (1.702 m), weight 128.8 kg (284 lb), SpO2 98 %.  Assuming care from Enid DerryAshley Wagner, PA-C.  In short, Patrick [name] is a 27 y.o. male with a chief complaint of Back Pain .  Refer to the original H&P for additional details.  The current plan of care is to review labs and discharge with urology follow-up instructions.  Patient and family understand plan of care. Questions encouraged and answered.    Lissa HoardMenshew, Jermell Holeman V Bacon, PA-C 08/25/17 1625    Dionne BucySiadecki, Sebastian, MD 08/25/17 2043

## 2017-08-29 ENCOUNTER — Encounter: Payer: Self-pay | Admitting: Emergency Medicine

## 2017-08-29 ENCOUNTER — Emergency Department
Admission: EM | Admit: 2017-08-29 | Discharge: 2017-08-29 | Disposition: A | Discharge status: Home / Self Care | Payer: Self-pay | Attending: Emergency Medicine | Admitting: Emergency Medicine

## 2017-08-29 DIAGNOSIS — Z7689 Persons encountering health services in other specified circumstances: Secondary | ICD-10-CM

## 2017-08-29 DIAGNOSIS — F17210 Nicotine dependence, cigarettes, uncomplicated: Secondary | ICD-10-CM | POA: Insufficient documentation

## 2017-08-29 DIAGNOSIS — Z0279 Encounter for issue of other medical certificate: Secondary | ICD-10-CM | POA: Insufficient documentation

## 2017-08-29 NOTE — ED Provider Notes (Signed)
**Patrick [name]** Patrick Patrick [name] ____________________________________________  Time seen: 1223  I have reviewed the triage vital signs and the nursing notes.  HISTORY  Chief Complaint  Patrick Patrick [name]  HPI Patrick Patrick [name] is a 27 y.o. male presents to the ED with request for return to Patrick evaluation.  Patient was seen in the ED about 4 days prior, for evaluation of hematuria as well as back pain.  He was cleared at that time but released to a 25 pound Patrick restriction.  He is requesting at this point a release to full duty as his employer will not let him to return without such Patrick [name].  He denies any interim complaints.  He is scheduled to see urology next week for further evaluation as suggested.  History reviewed. No pertinent past medical history.  There are no active problems to display for this patient.  History reviewed. No pertinent surgical history.  Prior to Admission medications   Medication Sig Start Date End Date Taking? Authorizing Provider  azithromycin (ZITHROMAX) 250 MG tablet Take 2 tablets by mouth on the first day. Take 1 tablet by mouth daily on days 2 through 5. 05/03/16   Orvil Feil, PA-C  baclofen (LIORESAL) 10 MG tablet Take 1 tablet (10 mg total) by mouth 3 (three) times daily. 10/01/15   Beers, Charmayne Sheer, PA-C  cyclobenzaprine (FLEXERIL) 5 MG tablet Take 1-2 tablets 3 times daily as needed 08/25/17   Enid Derry, PA-C  hydrochlorothiazide (HYDRODIURIL) 25 MG tablet Take 1 tablet (25 mg total) by mouth daily. 07/15/17   Minna Antis, MD  ibuprofen (ADVIL,MOTRIN) 800 MG tablet Take 1 tablet (800 mg total) by mouth every 8 (eight) hours as needed. 08/25/17   Enid Derry, PA-C  lidocaine (LIDODERM) 5 % Place 1 patch onto the skin every 12 (twelve) hours. Remove & Discard patch within 12 hours or as directed by MD 08/25/17 08/25/18  Enid Derry, PA-C  naproxen (NAPROSYN) 500 MG tablet Take 1 tablet (500 mg total) by mouth 2 (two)  times daily with a meal. 06/08/17   Triplett, Cari B, FNP  ondansetron (ZOFRAN ODT) 4 MG disintegrating tablet Take 1 tablet (4 mg total) by mouth every 8 (eight) hours as needed for nausea or vomiting. 10/05/16   Minna Antis, MD  predniSONE (DELTASONE) 10 MG tablet Take 6 tablets on day 1, take 5 tablets on day 2, take 4 tablets on day 3, take 3 tablets on day 4, take 2 tablets on day 5, take 1 tablet on day 6 10/26/16   Enid Derry, PA-C  sulfamethoxazole-trimethoprim (BACTRIM DS,SEPTRA DS) 800-160 MG tablet Take 1 tablet by mouth 2 (two) times daily. 07/15/17   Minna Antis, MD    Allergies Patient has no known allergies.  No family history on file.  Social History Social History   Tobacco Use  . Smoking status: Current Every Day Smoker    Packs/day: 0.50    Types: Cigarettes  . Smokeless tobacco: Never Used  Substance Use Topics  . Alcohol use: Yes  . Drug use: Not Currently    Review of Systems  Constitutional: Negative for fever. Eyes: Negative for visual changes. ENT: Negative for sore throat. Cardiovascular: Negative for chest pain. Respiratory: Negative for shortness of breath. Gastrointestinal: Negative for abdominal pain, vomiting and diarrhea. Genitourinary: Negative for dysuria. Musculoskeletal: Negative for back pain. Skin: Negative for rash. Neurological: Negative for headaches, focal weakness or numbness. ____________________________________________  PHYSICAL EXAM:  VITAL SIGNS: ED Triage Vitals [08/29/17  1140]  Enc Vitals Group     BP 128/88     Pulse Rate 82     Resp 16     Temp 98 F (36.7 C)     Temp Source Oral     SpO2 97 %     Weight 284 lb (128.8 kg)     Height 5\' 7"  (1.702 m)     Head Circumference      Peak Flow      Pain Score 0     Pain Loc      Pain Edu?      Excl. in GC?     Constitutional: Alert and oriented. Well appearing and in no distress. Head: Normocephalic and atraumatic. Eyes: Conjunctivae are normal.  Normal extraocular movements Cardiovascular: Normal rate, regular rhythm. Normal distal pulses. Respiratory: Normal respiratory effort. No wheezes/rales/rhonchi. Musculoskeletal: Nontender with normal range of motion in all extremities.  Neurologic:  Normal gait without ataxia. Normal speech and language. No gross focal neurologic deficits are appreciated. Skin:  Skin is warm, dry and intact. No rash noted. Psychiatric: Mood and affect are normal. Patient exhibits appropriate insight and judgment. ____________________________________________  INITIAL IMPRESSION / ASSESSMENT AND PLAN / ED COURSE  Patient with ED request for return to Patrick evaluation and Patrick [name].  Patient is without complaints at this time.  He is discharged to full active duty in 2 days as scheduled.  He will follow-up with urology and nephrology as discussed. ____________________________________________  FINAL CLINICAL IMPRESSION(S) / ED DIAGNOSES  Final diagnoses:  Return to Patrick evaluation      Karmen StabsMenshew, Charlesetta IvoryJenise V Bacon, PA-C 08/29/17 1910    Phineas SemenGoodman, Graydon, MD 08/30/17 1609

## 2017-08-29 NOTE — Discharge Instructions (Addendum)
You have been cleared to return to work as scheduled. Follow-up with Dr. Thedore MinsSingh as planned.

## 2017-08-29 NOTE — ED Notes (Signed)
Pt ambulatory upon discharge; declined wheel chair. Verbalized understanding of discharge instructions and follow-up care.

## 2017-08-29 NOTE — ED Triage Notes (Signed)
Pt seen here on 7/6 and d/c with back pain. Work noted state patient was on <25lbs lifting, needs a note stating that he can return to work without restrictions.

## 2017-11-08 ENCOUNTER — Emergency Department: Payer: BLUE CROSS/BLUE SHIELD

## 2017-11-08 ENCOUNTER — Encounter: Payer: Self-pay | Admitting: Emergency Medicine

## 2017-11-08 ENCOUNTER — Emergency Department
Admission: EM | Admit: 2017-11-08 | Discharge: 2017-11-08 | Disposition: A | Discharge status: Home / Self Care | Payer: BLUE CROSS/BLUE SHIELD | Attending: Emergency Medicine | Admitting: Emergency Medicine

## 2017-11-08 ENCOUNTER — Other Ambulatory Visit: Payer: Self-pay

## 2017-11-08 DIAGNOSIS — R05 Cough: Secondary | ICD-10-CM | POA: Diagnosis present

## 2017-11-08 DIAGNOSIS — F17210 Nicotine dependence, cigarettes, uncomplicated: Secondary | ICD-10-CM | POA: Diagnosis not present

## 2017-11-08 DIAGNOSIS — J189 Pneumonia, unspecified organism: Secondary | ICD-10-CM

## 2017-11-08 LAB — CBC WITH DIFFERENTIAL/PLATELET
BASOS ABS: 0 10*3/uL (ref 0–0.1)
Basophils Relative: 1 %
EOS PCT: 8 %
Eosinophils Absolute: 0.4 10*3/uL (ref 0–0.7)
HEMATOCRIT: 41.7 % (ref 40.0–52.0)
Hemoglobin: 14.3 g/dL (ref 13.0–18.0)
Lymphocytes Relative: 25 %
Lymphs Abs: 1.3 10*3/uL (ref 1.0–3.6)
MCH: 29.1 pg (ref 26.0–34.0)
MCHC: 34.3 g/dL (ref 32.0–36.0)
MCV: 84.8 fL (ref 80.0–100.0)
MONO ABS: 0.9 10*3/uL (ref 0.2–1.0)
MONOS PCT: 17 %
NEUTROS ABS: 2.6 10*3/uL (ref 1.4–6.5)
Neutrophils Relative %: 49 %
Platelets: 244 10*3/uL (ref 150–440)
RBC: 4.91 MIL/uL (ref 4.40–5.90)
RDW: 13.2 % (ref 11.5–14.5)
WBC: 5.3 10*3/uL (ref 3.8–10.6)

## 2017-11-08 LAB — COMPREHENSIVE METABOLIC PANEL
ALBUMIN: 4.1 g/dL (ref 3.5–5.0)
ALT: 35 U/L (ref 0–44)
ANION GAP: 8 (ref 5–15)
AST: 32 U/L (ref 15–41)
Alkaline Phosphatase: 55 U/L (ref 38–126)
BUN: 12 mg/dL (ref 6–20)
CO2: 28 mmol/L (ref 22–32)
Calcium: 9 mg/dL (ref 8.9–10.3)
Chloride: 103 mmol/L (ref 98–111)
Creatinine, Ser: 1.14 mg/dL (ref 0.61–1.24)
GFR calc Af Amer: >60 mL/min (ref >60)
GFR calc non Af Amer: >60 mL/min (ref >60)
GLUCOSE: 96 mg/dL (ref 70–99)
POTASSIUM: 3.8 mmol/L (ref 3.5–5.1)
Sodium: 139 mmol/L (ref 135–145)
TOTAL PROTEIN: 8 g/dL (ref 6.5–8.1)
Total Bilirubin: 0.6 mg/dL (ref 0.3–1.2)

## 2017-11-08 LAB — LACTIC ACID, PLASMA: Lactic Acid, Venous: 1.2 mmol/L (ref 0.5–1.9)

## 2017-11-08 MED ORDER — AZITHROMYCIN 250 MG PO TABS: ORAL_TABLET | ORAL | 0 refills | Status: DC

## 2017-11-08 MED ORDER — CEFTRIAXONE SODIUM 1 G IJ SOLR
1.0000 g | Freq: Once | INTRAMUSCULAR | Status: AC
  Administered 2017-11-08: 1 g via INTRAVENOUS
  Filled 2017-11-08: qty 10

## 2017-11-08 NOTE — ED Provider Notes (Signed)
[facility] Emergency Department Provider Note  ____________________________________________   First MD Initiated Contact with Patient 11/08/17 1947     (approximate)  I have reviewed the triage vital signs and the nursing notes.   HISTORY  Chief Complaint Cough    HPI Patrick [name] is a 27 y.o. male presents emergency department with cough and congestion.  He states he had a fever at home earlier.  He states he has been coughing up brown mucus for several days.  His mother had bronchitis he thinks he has the same.  He denies chest pain or shortness of breath.  He is a smoker.    History reviewed. No pertinent past medical history.  There are no active problems to display for this patient.   History reviewed. No pertinent surgical history.  Prior to Admission medications   Medication Sig Start Date End Date Taking? Authorizing Provider  azithromycin (ZITHROMAX) 250 MG tablet Take 2 tablets by mouth on the first day. Take 1 tablet by mouth daily on days 2 through 5. 11/08/17   Alicea Wente, Roselyn Bering, PA-C  baclofen (LIORESAL) 10 MG tablet Take 1 tablet (10 mg total) by mouth 3 (three) times daily. 10/01/15   Beers, Charmayne Sheer, PA-C  cyclobenzaprine (FLEXERIL) 5 MG tablet Take 1-2 tablets 3 times daily as needed 08/25/17   Enid Derry, PA-C  hydrochlorothiazide (HYDRODIURIL) 25 MG tablet Take 1 tablet (25 mg total) by mouth daily. 07/15/17   Minna Antis, MD  ibuprofen (ADVIL,MOTRIN) 800 MG tablet Take 1 tablet (800 mg total) by mouth every 8 (eight) hours as needed. 08/25/17   Enid Derry, PA-C  lidocaine (LIDODERM) 5 % Place 1 patch onto the skin every 12 (twelve) hours. Remove & Discard patch within 12 hours or as directed by MD 08/25/17 08/25/18  Enid Derry, PA-C  naproxen (NAPROSYN) 500 MG tablet Take 1 tablet (500 mg total) by mouth 2 (two) times daily with a meal. 06/08/17   Triplett, Cari B, FNP  ondansetron (ZOFRAN ODT) 4 MG disintegrating tablet  Take 1 tablet (4 mg total) by mouth every 8 (eight) hours as needed for nausea or vomiting. 10/05/16   Minna Antis, MD  predniSONE (DELTASONE) 10 MG tablet Take 6 tablets on day 1, take 5 tablets on day 2, take 4 tablets on day 3, take 3 tablets on day 4, take 2 tablets on day 5, take 1 tablet on day 6 10/26/16   Enid Derry, PA-C  sulfamethoxazole-trimethoprim (BACTRIM DS,SEPTRA DS) 800-160 MG tablet Take 1 tablet by mouth 2 (two) times daily. 07/15/17   Minna Antis, MD    Allergies Patient has no known allergies.  No family history on file.  Social History Social History   Tobacco Use  . Smoking status: Current Every Day Smoker    Packs/day: 0.50    Types: Cigarettes  . Smokeless tobacco: Never Used  Substance Use Topics  . Alcohol use: Yes  . Drug use: Not Currently    Review of Systems  Constitutional: Positive fever/chills Eyes: No visual changes. ENT: No sore throat.  Positive for congestion Respiratory: Positive for productive cough with brown mucus Genitourinary: Negative for dysuria. Musculoskeletal: Negative for back pain. Skin: Negative for rash.    ____________________________________________   PHYSICAL EXAM:  VITAL SIGNS: ED Triage Vitals  Enc Vitals Group     BP 11/08/17 1932 (!) 155/87     Pulse Rate 11/08/17 1932 97     Resp 11/08/17 1932 18  Temp --      Temp Source 11/08/17 1932 Oral     SpO2 11/08/17 1932 100 %     Weight 11/08/17 1931 272 lb (123.4 kg)     Height 11/08/17 1931 5\' 7"  (1.702 m)     Head Circumference --      Peak Flow --      Pain Score 11/08/17 1932 7     Pain Loc --      Pain Edu? --      Excl. in GC? --     Constitutional: Alert and oriented. Well appearing and in no acute distress. Eyes: Conjunctivae are normal.  Head: Atraumatic. Nose: No congestion/rhinnorhea. Mouth/Throat: Mucous membranes are moist.   Neck:  supple no lymphadenopathy noted Cardiovascular: Normal rate, regular rhythm. Heart  sounds are normal Respiratory: Normal respiratory effort.  No retractions, lungs c t a  GU: deferred Musculoskeletal: FROM all extremities, warm and well perfused Neurologic:  Normal speech and language.  Skin:  Skin is warm, dry and intact. No rash noted. Psychiatric: Mood and affect are normal. Speech and behavior are normal.  ____________________________________________   LABS (all labs ordered are listed, but only abnormal results are displayed)  Labs Reviewed  COMPREHENSIVE METABOLIC PANEL  CBC WITH DIFFERENTIAL/PLATELET  LACTIC ACID, PLASMA  LACTIC ACID, PLASMA   ____________________________________________   ____________________________________________  RADIOLOGY  Chest x-ray shows multilobular pneumonia  ____________________________________________   PROCEDURES  Procedure(s) performed: Saline lock, Rocephin 1 g IV  Procedures    ____________________________________________   INITIAL IMPRESSION / ASSESSMENT AND PLAN / ED COURSE  Pertinent labs & imaging results that were available during my care of the patient were reviewed by me and considered in my medical decision making (see chart for details).   Patient is a 27 year old male presents emergency department complaining of cough which is productive with brown mucus.  Fever.  Symptoms for several days.  On physical exam patient appears well and is afebrile at this time.  Cough is mild.  Lungs clear to auscultation.  Heart rate is normal.  Chest x-ray shows a multilobular pneumonia.  Explained chest x-ray findings to the patient.  Explained the labs.  Lactic acid at 1.2.  Sepsis, comprehensive metabolic panel is normal, CBC with normal WBC at 5.3  The patient was given 1 g Rocephin IV.  He was given a prescription for Z-Pak upon discharge.  He is to follow-up with his regular doctor or the acute care if not better in 3 days.  Return emergency department worsening.  Have a repeat chest x-ray in approximately  4 weeks to ensure the pneumonia cleared.  He states he understands will comply.  He was given a work note as he works in [employer] service.  He should not return until Wednesday of next week.  He states he understands will comply with our instructions.  He was discharged in stable condition in the care of his family.     As part of my medical decision making, I reviewed the following data within the electronic MEDICAL RECORD NUMBER Nursing notes reviewed and incorporated, Labs reviewed lactic acid normal, comprehensive metabolic panel normal, CBC normal, Old chart reviewed, Radiograph reviewed chest x-rays positive for multilobular pneumonia, Notes from prior ED visits and [place] Controlled Substance Database  ____________________________________________   FINAL CLINICAL IMPRESSION(S) / ED DIAGNOSES  Final diagnoses:  Community acquired pneumonia, unspecified laterality      NEW MEDICATIONS STARTED DURING THIS VISIT:  Discharge Medication List as of 11/08/2017 10:33  PM       Note:  This document was prepared using Dragon voice recognition software and may include unintentional dictation errors.    Faythe Ghee, PA-C 11/08/17 2303    Schaevitz, Myra Rude, MD 11/08/17 [phone]

## 2017-11-08 NOTE — ED Triage Notes (Addendum)
Patient ambulatory to triage with steady gait, without difficulty or distress noted; pt reports sinus congestion and prod cough light brown several days; denies fever; st soreness from frequent coughing and his mother was recently tx for bronchitis and is concerned he may have it; +smoker

## 2017-11-08 NOTE — Discharge Instructions (Addendum)
Follow-up with your regular doctor or the acute care if you are not better in 3 days.  Return to the emergency department if you are worsening.  Take the medication as prescribed.  Take Tylenol or ibuprofen as needed for fever or chills.  You are to remain out of work until Wednesday of next week due to the pneumonia.  He will need a repeat chest x-ray in about 4 weeks to make sure the pneumonia has cleared.

## 2017-12-21 ENCOUNTER — Encounter: Payer: Self-pay | Admitting: Emergency Medicine

## 2017-12-21 ENCOUNTER — Other Ambulatory Visit: Payer: Self-pay

## 2017-12-21 ENCOUNTER — Emergency Department: Payer: BLUE CROSS/BLUE SHIELD

## 2017-12-21 ENCOUNTER — Emergency Department
Admission: EM | Admit: 2017-12-21 | Discharge: 2017-12-21 | Disposition: A | Discharge status: Home / Self Care | Payer: BLUE CROSS/BLUE SHIELD | Attending: Emergency Medicine | Admitting: Emergency Medicine

## 2017-12-21 DIAGNOSIS — F17210 Nicotine dependence, cigarettes, uncomplicated: Secondary | ICD-10-CM | POA: Diagnosis not present

## 2017-12-21 DIAGNOSIS — J181 Lobar pneumonia, unspecified organism: Secondary | ICD-10-CM

## 2017-12-21 DIAGNOSIS — R05 Cough: Secondary | ICD-10-CM | POA: Diagnosis present

## 2017-12-21 DIAGNOSIS — J189 Pneumonia, unspecified organism: Secondary | ICD-10-CM | POA: Diagnosis not present

## 2017-12-21 MED ORDER — HYDROCOD POLST-CPM POLST ER 10-8 MG/5ML PO SUER: 5.0000 mL | Freq: Two times a day (BID) | ORAL | 0 refills | Status: DC

## 2017-12-21 MED ORDER — HYDROCOD POLST-CPM POLST ER 10-8 MG/5ML PO SUER
5.0000 mL | Freq: Once | ORAL | Status: AC
  Administered 2017-12-21: 5 mL via ORAL
  Filled 2017-12-21: qty 5

## 2017-12-21 MED ORDER — AZITHROMYCIN 250 MG PO TABS: ORAL_TABLET | ORAL | 0 refills | Status: AC

## 2017-12-21 NOTE — ED Triage Notes (Signed)
Cough, congestion

## 2017-12-21 NOTE — ED Provider Notes (Signed)
[facility] Emergency Department Provider Note   ____________________________________________   First MD Initiated Contact with Patient 12/21/17 1405     (approximate)  I have reviewed the triage vital signs and the nursing notes.   HISTORY  Chief Complaint Cough    HPI Patrick [name] is a 27 y.o. male patient complain of nonproductive cough and chest congestion for 2 weeks.  Patient states chills but no noticeable fever.  Patient denies nasal congestion or runny nose.  Patient denies sore throat.  Patient denies nausea, vomiting, diarrhea.  Patient rates his pain/ discomfort as  6/10.  History reviewed. No pertinent past medical history.  There are no active problems to display for this patient.   History reviewed. No pertinent surgical history.  Prior to Admission medications   Medication Sig Start Date End Date Taking? Authorizing Provider  azithromycin (ZITHROMAX Z-PAK) 250 MG tablet Take 2 tablets (500 mg) on  Day 1,  followed by 1 tablet (250 mg) once daily on Days 2 through 5. 12/21/17 12/26/17  Joni Reining, PA-C  azithromycin (ZITHROMAX) 250 MG tablet Take 2 tablets by mouth on the first day. Take 1 tablet by mouth daily on days 2 through 5. 11/08/17   Fisher, Roselyn Bering, PA-C  baclofen (LIORESAL) 10 MG tablet Take 1 tablet (10 mg total) by mouth 3 (three) times daily. 10/01/15   Beers, Charmayne Sheer, PA-C  chlorpheniramine-HYDROcodone (TUSSIONEX PENNKINETIC ER) 10-8 MG/5ML SUER Take 5 mLs by mouth 2 (two) times daily. 12/21/17   Joni Reining, PA-C  cyclobenzaprine (FLEXERIL) 5 MG tablet Take 1-2 tablets 3 times daily as needed 08/25/17   Enid Derry, PA-C  hydrochlorothiazide (HYDRODIURIL) 25 MG tablet Take 1 tablet (25 mg total) by mouth daily. 07/15/17   Minna Antis, MD  ibuprofen (ADVIL,MOTRIN) 800 MG tablet Take 1 tablet (800 mg total) by mouth every 8 (eight) hours as needed. 08/25/17   Enid Derry, PA-C  lidocaine (LIDODERM) 5 % Place  1 patch onto the skin every 12 (twelve) hours. Remove & Discard patch within 12 hours or as directed by MD 08/25/17 08/25/18  Enid Derry, PA-C  naproxen (NAPROSYN) 500 MG tablet Take 1 tablet (500 mg total) by mouth 2 (two) times daily with a meal. 06/08/17   Triplett, Cari B, FNP  ondansetron (ZOFRAN ODT) 4 MG disintegrating tablet Take 1 tablet (4 mg total) by mouth every 8 (eight) hours as needed for nausea or vomiting. 10/05/16   Minna Antis, MD  predniSONE (DELTASONE) 10 MG tablet Take 6 tablets on day 1, take 5 tablets on day 2, take 4 tablets on day 3, take 3 tablets on day 4, take 2 tablets on day 5, take 1 tablet on day 6 10/26/16   Enid Derry, PA-C  sulfamethoxazole-trimethoprim (BACTRIM DS,SEPTRA DS) 800-160 MG tablet Take 1 tablet by mouth 2 (two) times daily. 07/15/17   Minna Antis, MD    Allergies Patient has no known allergies.  No family history on file.  Social History Social History   Tobacco Use  . Smoking status: Current Every Day Smoker    Packs/day: 0.50    Types: Cigarettes  . Smokeless tobacco: Never Used  Substance Use Topics  . Alcohol use: Yes  . Drug use: Not Currently    Review of Systems Constitutional: No fever/chills Eyes: No visual changes. ENT: No sore throat. Cardiovascular: Denies chest pain. Respiratory: Denies shortness of breath.  Productive cough Gastrointestinal: No abdominal pain.  No nausea, no vomiting.  No diarrhea.  No constipation. Genitourinary: Negative for dysuria. Musculoskeletal: Negative for back pain. Skin: Negative for rash. Neurological: Negative for headaches, focal weakness or numbness.   ____________________________________________   PHYSICAL EXAM:  VITAL SIGNS: ED Triage Vitals  Enc Vitals Group     BP 12/21/17 1353 139/76     Pulse Rate 12/21/17 1353 96     Resp 12/21/17 1353 18     Temp 12/21/17 1353 98.5 F (36.9 C)     Temp Source 12/21/17 1353 Oral     SpO2 12/21/17 1353 97 %     Weight  12/21/17 1345 278 lb (126.1 kg)     Height 12/21/17 1345 5\' 7"  (1.702 m)     Head Circumference --      Peak Flow --      Pain Score 12/21/17 1344 6     Pain Loc --      Pain Edu? --      Excl. in GC? --    Constitutional: Alert and oriented. Well appearing and in no acute distress. Neck: No stridor.  Hematological/Lymphatic/Immunilogical: No cervical lymphadenopathy. Cardiovascular: Normal rate, regular rhythm. Grossly normal heart sounds.  Good peripheral circulation. Respiratory: Normal respiratory effort.  No retractions. Lungs no Rales Gastrointestinal: Soft and nontender. No distention. No abdominal bruits. No CVA tenderness. Musculoskeletal: No lower extremity tenderness nor edema.  No joint effusions. Neurologic:  Normal speech and language. No gross focal neurologic deficits are appreciated. No gait instability. Skin:  Skin is warm, dry and intact. No rash noted. Psychiatric: Mood and affect are normal. Speech and behavior are normal.  ____________________________________________   LABS (all labs ordered are listed, but only abnormal results are displayed)  Labs Reviewed - No data to display ____________________________________________  EKG   ____________________________________________  RADIOLOGY  ED MD interpretation:    Official radiology report(s): Dg Chest 2 View  Result Date: 12/21/2017 CLINICAL DATA:  Nonproductive cough 2 weeks EXAM: CHEST - 2 VIEW COMPARISON:  11/08/2017 FINDINGS: Patchy bibasilar airspace disease with partial clearing. No new area of infiltrate or effusion. Heart size normal. Vascularity normal. Normal skeletal structures. IMPRESSION: Partial clearing of bibasilar airspace disease most likely due to pneumonia. Continued follow-up until clearing recommended. Electronically Signed   By: Marlan Palau M.D.   On: 12/21/2017 14:50    ____________________________________________   PROCEDURES  Procedure(s) performed:  None  Procedures  Critical Care performed: No  ____________________________________________   INITIAL IMPRESSION / ASSESSMENT AND PLAN / ED COURSE  As part of my medical decision making, I reviewed the following data within the electronic MEDICAL RECORD NUMBER    Cough congestion secondary pneumonia.  Discussed x-ray findings with patient.  Patient given discharge care instruction advised take medication as directed.  Patient given a work note and advised to follow-up with the [facility] if condition persist.      ____________________________________________   FINAL CLINICAL IMPRESSION(S) / ED DIAGNOSES  Final diagnoses:  Community acquired pneumonia of right lower lobe of lung [facility])     ED Discharge Orders         Ordered    azithromycin (ZITHROMAX Z-PAK) 250 MG tablet     12/21/17 1523    chlorpheniramine-HYDROcodone (TUSSIONEX PENNKINETIC ER) 10-8 MG/5ML SUER  2 times daily     12/21/17 1527           Note:  This document was prepared using Dragon voice recognition software and may include unintentional dictation errors.    Joni Reining,  PA-C 12/21/17 1527    Schaevitz, Myra Rude, MD 12/22/17 [phone]

## 2017-12-21 NOTE — ED Notes (Signed)
Pt denies fever at home.  

## 2018-01-21 ENCOUNTER — Other Ambulatory Visit: Payer: Self-pay

## 2018-01-21 ENCOUNTER — Encounter: Payer: Self-pay | Admitting: Emergency Medicine

## 2018-01-21 ENCOUNTER — Emergency Department: Payer: BLUE CROSS/BLUE SHIELD

## 2018-01-21 ENCOUNTER — Emergency Department
Admission: EM | Admit: 2018-01-21 | Discharge: 2018-01-22 | Disposition: A | Discharge status: Home / Self Care | Payer: BLUE CROSS/BLUE SHIELD | Attending: Emergency Medicine | Admitting: Emergency Medicine

## 2018-01-21 DIAGNOSIS — G8929 Other chronic pain: Secondary | ICD-10-CM | POA: Diagnosis not present

## 2018-01-21 DIAGNOSIS — M545 Low back pain: Secondary | ICD-10-CM | POA: Insufficient documentation

## 2018-01-21 DIAGNOSIS — F17210 Nicotine dependence, cigarettes, uncomplicated: Secondary | ICD-10-CM | POA: Insufficient documentation

## 2018-01-21 DIAGNOSIS — Z79899 Other long term (current) drug therapy: Secondary | ICD-10-CM | POA: Diagnosis not present

## 2018-01-21 DIAGNOSIS — R05 Cough: Secondary | ICD-10-CM | POA: Insufficient documentation

## 2018-01-21 DIAGNOSIS — R059 Cough, unspecified: Secondary | ICD-10-CM

## 2018-01-21 LAB — CBC WITH DIFFERENTIAL/PLATELET
ABS IMMATURE GRANULOCYTES: 0.01 10*3/uL (ref 0.00–0.07)
Basophils Absolute: 0 10*3/uL (ref 0.0–0.1)
Basophils Relative: 1 %
EOS ABS: 0.2 10*3/uL (ref 0.0–0.5)
Eosinophils Relative: 5 %
HEMATOCRIT: 43.1 % (ref 39.0–52.0)
HEMOGLOBIN: 13.9 g/dL (ref 13.0–17.0)
IMMATURE GRANULOCYTES: 0 %
LYMPHS ABS: 0.9 10*3/uL (ref 0.7–4.0)
LYMPHS PCT: 23 %
MCH: 27.8 pg (ref 26.0–34.0)
MCHC: 32.3 g/dL (ref 30.0–36.0)
MCV: 86.2 fL (ref 80.0–100.0)
MONOS PCT: 15 %
Monocytes Absolute: 0.6 10*3/uL (ref 0.1–1.0)
NEUTROS PCT: 56 %
Neutro Abs: 2.3 10*3/uL (ref 1.7–7.7)
Platelets: 272 10*3/uL (ref 150–400)
RBC: 5 MIL/uL (ref 4.22–5.81)
RDW: 12.6 % (ref 11.5–15.5)
WBC: 4 10*3/uL (ref 4.0–10.5)
nRBC: 0 % (ref 0.0–0.2)

## 2018-01-21 LAB — HEPATIC FUNCTION PANEL
ALT: 27 U/L (ref 0–44)
AST: 29 U/L (ref 15–41)
Albumin: 3.6 g/dL (ref 3.5–5.0)
Alkaline Phosphatase: 56 U/L (ref 38–126)
BILIRUBIN DIRECT: 0.1 mg/dL (ref 0.0–0.2)
BILIRUBIN INDIRECT: 0.4 mg/dL (ref 0.3–0.9)
Total Bilirubin: 0.5 mg/dL (ref 0.3–1.2)
Total Protein: 7.5 g/dL (ref 6.5–8.1)

## 2018-01-21 LAB — BASIC METABOLIC PANEL
ANION GAP: 7 (ref 5–15)
BUN: 9 mg/dL (ref 6–20)
CO2: 26 mmol/L (ref 22–32)
Calcium: 8.8 mg/dL — ABNORMAL LOW (ref 8.9–10.3)
Chloride: 105 mmol/L (ref 98–111)
Creatinine, Ser: 1.05 mg/dL (ref 0.61–1.24)
GFR calc Af Amer: >60 mL/min (ref >60)
GFR calc non Af Amer: >60 mL/min (ref >60)
Glucose, Bld: 95 mg/dL (ref 70–99)
POTASSIUM: 3.8 mmol/L (ref 3.5–5.1)
Sodium: 138 mmol/L (ref 135–145)

## 2018-01-21 LAB — LACTATE DEHYDROGENASE: LDH: 194 U/L — AB (ref 98–192)

## 2018-01-21 MED ORDER — KETOROLAC TROMETHAMINE 30 MG/ML IJ SOLN
30.0000 mg | Freq: Once | INTRAMUSCULAR | Status: AC
  Administered 2018-01-21: 30 mg via INTRAVENOUS
  Filled 2018-01-21: qty 1

## 2018-01-21 MED ORDER — IOHEXOL 300 MG/ML  SOLN
75.0000 mL | Freq: Once | INTRAMUSCULAR | Status: AC | PRN
  Administered 2018-01-21: 75 mL via INTRAVENOUS
  Filled 2018-01-21: qty 75

## 2018-01-21 MED ORDER — ORPHENADRINE CITRATE 30 MG/ML IJ SOLN
60.0000 mg | INTRAMUSCULAR | Status: AC
  Administered 2018-01-21: 60 mg via INTRAVENOUS
  Filled 2018-01-21: qty 2

## 2018-01-21 MED ORDER — KETOROLAC TROMETHAMINE 10 MG PO TABS: 10.0000 mg | ORAL_TABLET | Freq: Three times a day (TID) | ORAL | 0 refills | Status: DC

## 2018-01-21 MED ORDER — CYCLOBENZAPRINE HCL 5 MG PO TABS: 5.0000 mg | ORAL_TABLET | Freq: Three times a day (TID) | ORAL | 0 refills | Status: AC | PRN

## 2018-01-21 NOTE — ED Notes (Signed)
BMP redrawn and sent to lab

## 2018-01-21 NOTE — ED Provider Notes (Signed)
[facility] Emergency Department Provider Note ____________________________________________  Time seen: 1814  I have reviewed the triage vital signs and the nursing notes.  HISTORY  Chief Complaint  Back Pain and Cough  HPI Patrick [name] is a 27 y.o. male who presents to the ED for evaluation of continued LBP and reports continued minimally productive cough. He denies any recent injury or trauma, he has been seen several times for recurrent low back pain. He is without distal paresthesias, footdrop, saddle anesthesias, or bladder or bowel incontinence.  He has been taking BC powders with limited benefit.  His secondary complaint is for continued productive cough without purulence, hemoptysis, wheezing, or shortness of breath.  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  He denies any intermittent fevers, weight loss, night sweats, or abnormal bleeding.  He was last seen here on November 2, and treated with a azithromycin for presumed continued changes consistent with community-acquired pneumonia.  Patient is a current everyday smoker, but denies any other significant medical history and takes no daily medications.  History reviewed. No pertinent past medical history.  There are no active problems to display for this patient.  History reviewed. No pertinent surgical history.  Prior to Admission medications   Medication Sig Start Date End Date Taking? Authorizing Provider  azithromycin (ZITHROMAX) 250 MG tablet Take 2 tablets by mouth on the first day. Take 1 tablet by mouth daily on days 2 through 5. 11/08/17   Fisher, Roselyn Bering, PA-C  chlorpheniramine-HYDROcodone (TUSSIONEX PENNKINETIC ER) 10-8 MG/5ML SUER Take 5 mLs by mouth 2 (two) times daily. 12/21/17   Joni Reining, PA-C  cyclobenzaprine (FLEXERIL) 5 MG tablet Take 1 tablet (5 mg total) by mouth 3 (three) times daily as needed for  up to 10 days for muscle spasms. 01/21/18 01/31/18  Karleen Seebeck, Charlesetta Ivory, PA-C  hydrochlorothiazide (HYDRODIURIL) 25 MG tablet Take 1 tablet (25 mg total) by mouth daily. 07/15/17   Minna Antis, MD  ketorolac (TORADOL) 10 MG tablet Take 1 tablet (10 mg total) by mouth every 8 (eight) hours. 01/21/18   Aleanna Menge, Charlesetta Ivory, PA-C  ondansetron (ZOFRAN ODT) 4 MG disintegrating tablet Take 1 tablet (4 mg total) by mouth every 8 (eight) hours as needed for nausea or vomiting. 10/05/16   Minna Antis, MD    Allergies Patient has no known allergies.  History reviewed. No pertinent family history.  Social History Social History   Tobacco Use  . Smoking status: Current Every Day Smoker    Packs/day: 0.50    Types: Cigarettes  . Smokeless tobacco: Never Used  Substance Use Topics  . Alcohol use: Yes  . Drug use: Not Currently    Review of Systems  Constitutional: Negative for fever, chills, sweats, or weight loss. Eyes: Negative for visual changes. ENT: Negative for sore throat. Cardiovascular: Negative for chest pain. Respiratory: Negative for shortness of breath.  Reports cough Gastrointestinal: Negative for abdominal pain, vomiting and diarrhea. Genitourinary: Negative for dysuria. Musculoskeletal: Positive for back pain. Skin: Negative for rash. Neurological: Negative for headaches, focal weakness or numbness. ____________________________________________  PHYSICAL EXAM:  VITAL SIGNS: ED Triage Vitals  Enc Vitals Group     BP 01/21/18 1804 (!) 131/92     Pulse Rate 01/21/18 1803 99     Resp 01/21/18 1803 18     Temp 01/21/18 1803 98.7 F (37.1 C)  Temp Source 01/21/18 1803 Oral     SpO2 01/21/18 1803 99 %     Weight 01/21/18 1803 276 lb (125.2 kg)     Height 01/21/18 1803 5\' 7"  (1.702 m)     Head Circumference --      Peak Flow --      Pain Score 01/21/18 1807 7     Pain Loc --      Pain Edu? --      Excl. in GC? --     Constitutional: Alert and  oriented. Well appearing and in no distress. Head: Normocephalic and atraumatic. Eyes: Conjunctivae are normal. Normal extraocular movements Nose: No congestion/rhinorrhea/epistaxis. Cardiovascular: Normal rate, regular rhythm. Normal distal pulses. Respiratory: Normal respiratory effort. No wheezes/rales/rhonchi. Gastrointestinal: Soft and nontender. No distention. Musculoskeletal: Normal spinal alignment without midline tenderness, spasm, deformity, or step-off.  Patient transitions from sit to stand without assistance.  Able to demonstrate normal lumbar flexion and extension range.  Lumbar flexion and is full however does elicit subjective complaints of pain.  Nontender with normal range of motion in all extremities.  Neurologic: Cranial nerves II through XII grossly intact.  Normal LE DTRs bilaterally.  Negative seated straight leg raise bilaterally.  Normal gait without ataxia. Normal speech and language. No gross focal neurologic deficits are appreciated. Skin:  Skin is warm, dry and intact. No rash noted. Psychiatric: Mood and affect are normal. Patient exhibits appropriate insight and judgment. ____________________________________________   LABS (pertinent positives/negatives) Labs Reviewed  BASIC METABOLIC PANEL - Abnormal; Notable for the following components:      Result Value   Calcium 8.8 (*)    All other components within normal limits  LACTATE DEHYDROGENASE - Abnormal; Notable for the following components:   LDH 194 (*)    All other components within normal limits  CBC WITH DIFFERENTIAL/PLATELET  HEPATIC FUNCTION PANEL  ____________________________________________  EKG  ____________________________________________   RADIOLOGY  CXR  IMPRESSION: Persistent bilateral infiltrates. The persistence since September 2019 would be unusual for an infectious infiltrate. Recommend CT imaging.  CT Chest w/ CM  IMPRESSION: Multilobar airspace opacities are noted with air  bronchograms suspicious for changes of pneumonia. Some of this could potentially be postobstructive in etiology given presence of enlarged mediastinal and bilateral hilar lymph nodes. Although these lymph nodes could potentially be reactive, the possibility of a neoplastic etiology such as lymphoma is of concern given the rather bulky size of these lymph nodes. ____________________________________________  PROCEDURES  Procedures Toradol 30 mg IVP Norflex 60 mg IVP ____________________________________________  INITIAL IMPRESSION / ASSESSMENT AND PLAN / ED COURSE  ----------------------------------------- 11:08 PM on 01/21/2018 ----------------------------------------- S/w Dr. Ascencion DikeBrahamandy. He reviewed the case and results and will be willing to see the patient in the outpatient setting after further evaluation with pulmonary. He suggest liver functions and LDH be added to labs.  ----------------------------------------- 11:25 PM on 01/21/2018 -----------------------------------------  L/M for Dr. Lucianne LeiSa Khan Gave details about CXR/CT results and phone consultation with Dr. Ascencion DikeBrahamandy.   Patient with ED evaluation of chronic intermittent low back pain as well as persistent cough since September.  Patient was treated empirically with IV medications for his muscular skeletal back pain.  He reported resolution of his symptoms at the time of discharge.  His CT scan following initial chest x-ray concerning for some mediastinal lymphadenopathy.  The patient's imaging is concerning for possible neoplastic etiology.  Patient was notified of the results, and is further advised that he is to follow-up with pulmonary medicine in the outpatient  setting.  Patient is advised to contact pulmonary medicine if he is not contacted by the office directly in 48 hours.  Patient verbalized understanding of the discharge instructions, and will be discharged with prescriptions for his low back  pain. ____________________________________________  FINAL CLINICAL IMPRESSION(S) / ED DIAGNOSES  Final diagnoses:  Chronic midline low back pain without sciatica  Cough     Evalise Abruzzese, Charlesetta Ivory, PA-C 01/22/18 0026    Arnaldo Natal, MD 01/22/18 [phone]

## 2018-01-21 NOTE — ED Triage Notes (Signed)
Pt presents to ED via POV c/o "throbbing" lower back pain. 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   Pt also reports previously being seen for PNA and sent home with Z-pack. States he feels like he never fully recovered. Reports cough with clear sputum, chills, body aches.

## 2018-01-21 NOTE — Discharge Instructions (Signed)
You are being treated for low back pain. Take the prescription meds as directed. Follow-up with Pulmonary medicine for further evaluate the chest x-ray and CT scan findings. Return the ED as needed.

## 2018-01-22 NOTE — ED Notes (Signed)
Pt ambulatory to POV without difficulty. VSS. NAD. Discharge instructions, RX and follow up reviewed. All questions and concerns reviewed.  

## 2018-02-15 DIAGNOSIS — R590 Localized enlarged lymph nodes: Secondary | ICD-10-CM | POA: Insufficient documentation

## 2018-02-15 NOTE — Progress Notes (Signed)
[facility]  Telephone:(336) [phone] Fax:(336) [phone]  ID: Patrick [name] OB: [birth year]  MR#: [id number]  QIO#:[id number]#:[id number]  Patient Care Team: [facility] - [facility] (General Practice)  CHIEF COMPLAINT: Mediastinal lymphadenopathy.  INTERVAL HISTORY: Patient is a 27 year old male who over the past several months was noted to have chronic cough as well as persistent multilobar pneumonia despite multiple rounds of antibiotics.  Subsequent CT scan revealed enlarged mediastinal and hilar lymphadenopathy bigger than expected for simply reactive lymph nodes.  Patient also reports a significant amount of weight loss and intermittent night sweats over the past several months.  He otherwise feels well.  He has no neurologic complaints.  He denies any chest pain or shortness of breath.  He denies any nausea, vomiting, constipation, or diarrhea.  He has no urinary complaints.  Patient otherwise feels well and offers no further specific complaints today.  REVIEW OF SYSTEMS:   Review of Systems  Constitutional: Positive for chills, diaphoresis and weight loss. Negative for fever and malaise/fatigue.  Respiratory: Positive for cough. Negative for hemoptysis and shortness of breath.   Cardiovascular: Negative.  Negative for chest pain and leg swelling.  Gastrointestinal: Negative.  Negative for abdominal pain.  Genitourinary: Negative.  Negative for dysuria.  Musculoskeletal: Negative.  Negative for back pain.  Skin: Negative.  Negative for rash.  Neurological: Negative.  Negative for speech change, weakness and headaches.  Psychiatric/Behavioral: Negative.  The patient is not nervous/anxious.     As per HPI. Otherwise, a complete review of systems is negative.  PAST MEDICAL HISTORY: History reviewed. No pertinent past medical history.  PAST SURGICAL HISTORY: History reviewed. No pertinent surgical history.  FAMILY HISTORY: Family History    Problem Relation Age of Onset  . Kidney cancer Maternal Grandmother     ADVANCED DIRECTIVES (Y/N):  N  HEALTH MAINTENANCE: Social History   Tobacco Use  . Smoking status: Current Every Day Smoker    Packs/day: 0.50    Types: Cigarettes  . Smokeless tobacco: Never Used  Substance Use Topics  . Alcohol use: Yes  . Drug use: Not Currently     Colonoscopy:  PAP:  Bone density:  Lipid panel:  No Known Allergies  Current Outpatient Medications  Medication Sig Dispense Refill  . azithromycin (ZITHROMAX) 250 MG tablet Take 2 tablets by mouth on the first day. Take 1 tablet by mouth daily on days 2 through 5. (Patient not taking: Reported on 02/19/2018) 6 each 0  . chlorpheniramine-HYDROcodone (TUSSIONEX PENNKINETIC ER) 10-8 MG/5ML SUER Take 5 mLs by mouth 2 (two) times daily. (Patient not taking: Reported on 02/19/2018) 115 mL 0  . hydrochlorothiazide (HYDRODIURIL) 25 MG tablet Take 1 tablet (25 mg total) by mouth daily. (Patient not taking: Reported on 02/19/2018) 30 tablet 1  . ketorolac (TORADOL) 10 MG tablet Take 1 tablet (10 mg total) by mouth every 8 (eight) hours. (Patient not taking: Reported on 02/19/2018) 15 tablet 0  . ondansetron (ZOFRAN ODT) 4 MG disintegrating tablet Take 1 tablet (4 mg total) by mouth every 8 (eight) hours as needed for nausea or vomiting. (Patient not taking: Reported on 02/19/2018) 20 tablet 0   No current facility-administered medications for this visit.     OBJECTIVE: Vitals:   02/19/18 1000  BP: (!) 135/95  Pulse: (!) 101  Resp: 18  Temp: 97.7 F (36.5 C)     Body mass index is 39.44 kg/m.    ECOG FS:0 - Asymptomatic  General: Well-developed,  well-nourished, no acute distress. Eyes: Pink conjunctiva, anicteric sclera. HEENT: Normocephalic, moist mucous membranes, clear oropharnyx. Lungs: Clear to auscultation bilaterally. Heart: Regular rate and rhythm. No rubs, murmurs, or gallops. Abdomen: Soft, nontender, nondistended. No  organomegaly noted, normoactive bowel sounds. Musculoskeletal: No edema, cyanosis, or clubbing. Neuro: Alert, answering all questions appropriately. Cranial nerves grossly intact. Skin: No rashes or petechiae noted. Psych: Normal affect. Lymphatics: No cervical, calvicular, axillary or inguinal LAD.   LAB RESULTS:  Lab Results  Component Value Date   NA 138 01/21/2018   K 3.8 01/21/2018   CL 105 01/21/2018   CO2 26 01/21/2018   GLUCOSE 95 01/21/2018   BUN 9 01/21/2018   CREATININE 1.05 01/21/2018   CALCIUM 8.8 (L) 01/21/2018   PROT 7.5 01/21/2018   ALBUMIN 3.6 01/21/2018   AST 29 01/21/2018   ALT 27 01/21/2018   ALKPHOS 56 01/21/2018   BILITOT 0.5 01/21/2018   GFRNONAA >60 01/21/2018   GFRAA >60 01/21/2018    Lab Results  Component Value Date   WBC 4.0 01/21/2018   NEUTROABS 2.3 01/21/2018   HGB 13.9 01/21/2018   HCT 43.1 01/21/2018   MCV 86.2 01/21/2018   PLT 272 01/21/2018     STUDIES: Dg Chest 2 View  Result Date: 01/21/2018 CLINICAL DATA:  Productive cough. EXAM: CHEST - 2 VIEW COMPARISON:  December 21, 2017 FINDINGS: Infiltrate in the left lateral mid lung in the right base has been stable since September 2019. The heart, hila, mediastinum, lungs, and pleura are otherwise unremarkable. IMPRESSION: Persistent bilateral infiltrates. The persistence since September 2019 would be unusual for an infectious infiltrate. Recommend CT imaging. Electronically Signed   By: Gerome Sam III M.D   On: 01/21/2018 18:34   Ct Chest W Contrast  Result Date: 01/21/2018 CLINICAL DATA:  Multilobar pneumonia never fully recovered. Cough with clear sputum, chills and body aches. EXAM: CT CHEST WITH CONTRAST TECHNIQUE: Multidetector CT imaging of the chest was performed during intravenous contrast administration. CONTRAST:  75mL OMNIPAQUE IOHEXOL 300 MG/ML  SOLN COMPARISON:  None. FINDINGS: Cardiovascular: Conventional branch pattern of the great vessels. Nonaneurysmal thoracic aorta  without dissection. Suboptimal opacification of the pulmonary arteries. No large central pulmonary embolus however is noted given limitations due to timing of contrast. Heart size is top normal without pericardial effusion or thickening. Mediastinum/Nodes: Bulky mediastinal and bilateral hilar lymphadenopathy is identified raising concern for possible lymphoma differential considerations may include noncaseating granulomatous disease. Index nodes in the right upper paratracheal, AP window and subcarinal stations measure 2.5 cm short axis. Enlarged hilar lymph nodes are present measuring 3 cm on the right and 2 cm on the left. Lungs/Pleura: Multilobar airspace opacities are identified, nonspecific in the left upper lobe, lingula and right greater than left lower lobes. Air bronchograms are identified. No pleural effusion or pneumothorax. Upper Abdomen: No acute abnormality. Musculoskeletal: No chest wall abnormality. No acute or significant osseous findings. IMPRESSION: Multilobar airspace opacities are noted with air bronchograms suspicious for changes of pneumonia. Some of this could potentially be postobstructive in etiology given presence of enlarged mediastinal and bilateral hilar lymph nodes. Although these lymph nodes could potentially be reactive, the possibility of a neoplastic etiology such as lymphoma is of concern given the rather bulky size of these lymph nodes. Electronically Signed   By: Tollie Eth M.D.   On: 01/21/2018 21:37    ASSESSMENT: Mediastinal lymphadenopathy.  PLAN:    1. Mediastinal lymphandenopathy: CT scan results from January 21, 2018 reviewed independently and reported  as above.  Although lymph nodes are potentially reactive, lymphoma is a distinct possibility given his weight loss and night sweats.  Patient was given a referral to pulmonary for consideration of bronchoscopy and biopsy.  Will also discuss case at cancer conference later this week.  Patient will return to clinic 1  week after his biopsy to discuss the results and any treatment planning necessary.  I spent a total of 60 minutes face-to-face with the patient of which greater than 50% of the visit was spent in counseling and coordination of care as detailed above.  Patient expressed understanding and was in agreement with this plan. He also understands that He can call clinic at any time with any questions, concerns, or complaints.   Cancer Staging No matching staging information was found for the patient.  Jeralyn Ruthsimothy J Finnegan, MD   02/20/2018 9:26 AM

## 2018-02-19 ENCOUNTER — Inpatient Hospital Stay: Payer: BLUE CROSS/BLUE SHIELD | Attending: Oncology | Admitting: Oncology

## 2018-02-19 ENCOUNTER — Encounter: Payer: Self-pay | Admitting: Oncology

## 2018-02-19 DIAGNOSIS — R634 Abnormal weight loss: Secondary | ICD-10-CM

## 2018-02-19 DIAGNOSIS — R61 Generalized hyperhidrosis: Secondary | ICD-10-CM | POA: Diagnosis not present

## 2018-02-19 DIAGNOSIS — R590 Localized enlarged lymph nodes: Secondary | ICD-10-CM | POA: Diagnosis present

## 2018-02-19 DIAGNOSIS — Z720 Tobacco use: Secondary | ICD-10-CM

## 2018-02-21 ENCOUNTER — Other Ambulatory Visit: Payer: BLUE CROSS/BLUE SHIELD

## 2018-02-21 NOTE — Progress Notes (Signed)
Tumor Board Documentation  Patrick [name] was presented by Dr Orlie Dakin at our Tumor Board on 02/21/2018, which included representatives from medical oncology, radiation oncology, surgical oncology, navigation, pathology, radiology, surgical, research, pulmonology.  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  Additionally, we reviewed previous medical and familial history, history of present illness, and recent lab results along with all available histopathologic and imaging studies. The tumor board considered available treatment options and made the following recommendations: Biopsy Refer to Pulmonology  The following procedures/referrals were also placed: No orders of the defined types were placed in this encounter.   Clinical Trial Status: not discussed   Staging used:    National site-specific guidelines   were discussed with respect to the case.  Tumor board is a meeting of clinicians from various specialty areas who evaluate and discuss patients for whom a multidisciplinary approach is being considered. Final determinations in the plan of care are those of the provider(s). The responsibility for follow up of recommendations given during tumor board is that of the provider.   Today's extended care, comprehensive team conference, Patrick [name] was not present for the discussion and was not examined.   Multidisciplinary Tumor Board is a multidisciplinary case peer review process.  Decisions discussed in the Multidisciplinary Tumor Board reflect the opinions of the specialists present at the conference without having examined the patient.  Ultimately, treatment and diagnostic decisions rest with the primary provider(s) and the patient.

## 2018-02-25 ENCOUNTER — Encounter: Payer: Self-pay | Admitting: Pulmonary Disease

## 2018-02-25 ENCOUNTER — Ambulatory Visit: Payer: BLUE CROSS/BLUE SHIELD | Admitting: Pulmonary Disease

## 2018-02-25 VITALS — BP 160/100 | HR 108 | Ht 67.0 in | Wt 252.6 lb

## 2018-02-25 DIAGNOSIS — F17200 Nicotine dependence, unspecified, uncomplicated: Secondary | ICD-10-CM | POA: Diagnosis not present

## 2018-02-25 DIAGNOSIS — R918 Other nonspecific abnormal finding of lung field: Secondary | ICD-10-CM

## 2018-02-25 DIAGNOSIS — R590 Localized enlarged lymph nodes: Secondary | ICD-10-CM | POA: Diagnosis not present

## 2018-02-25 NOTE — H&P (View-Only) (Signed)
PULMONARY CONSULT NOTE  Requesting MD/Service: Finnegan Date of initial consultation: 02/25/2018 Reason for consultation: Bulky hilar and mediastinal adenopathy and bilateral pulmonary infiltrates worrisome for sarcoidosis versus lymphoma  PT PROFILE: 27 y.o. male smoker with 4-month history of abnormal chest radiographs and CT scan raising concern for lymphoma (versus sarcoidosis)  DATA: 05/03/16 CXR: No acute cardiac or pulmonary findings 11/08/17 CXR: Bilateral hilar fullness.  Scattered bilateral pulmonary opacities most prominent in RLL 12/21/17 CXR: No significant change bilateral hilar fullness.  Increased paratracheal lymph node.  Partial clearing of RLL opacity 01/21/18 CXR: No significant change to slight progression of bilateral hilar fullness and paratracheal opacity.  Persistent vague RLL opacity 01/21/18 CT chest: Bulky hilar and mediastinal adenopathy.  Patchy bilateral pulmonary infiltrates most prominently in RLL and LUL  INTERVAL:  HPI:  4 months ago he presented to [facility] ED with cough, chest tightness and rattling sensation in his chest.  His cough was nonproductive.  He had no definite fevers.  He had minimal shortness of breath which was exacerbated by extreme cold.  Chest x-ray revealed findings as documented above (11/08/2017).  He was treated with antibiotics.  His symptoms did not improve and he presented again in early November.  He was again treated with antibiotics.  His symptoms persisted and he again presented to [facility] ED in early December with a repeat chest x-ray demonstrating similar findings.  Because of the persistent chest x-ray findings a CT scan of the chest was performed with findings as documented above.  Based on CT scan of the chest the concern has been raised for lymphoma.  He was referred to oncology, presented at oncology conference last week and referred for me for diagnostic bronchoscopy.  At the present time, he continues to have cough and shortness of  breath exacerbated by extreme cold.  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  This is when he notices shortness of breath and chest tightness.  Overall, his shortness of breath is rather minimal.  He has lost 60 pounds in the past year, some of it intentionally.  He denies fever.  He does have occasional night sweats.  He has no other lymphadenopathy.  He denies significant fatigue.  History reviewed. No pertinent past medical history.  History reviewed. No pertinent surgical history.  MEDICATIONS: I have reviewed all medications and confirmed regimen as documented  Social History   Socioeconomic History  . Marital status: Single    Spouse name: Not on file  . Number of children: Not on file  . Years of education: Not on file  . Highest education level: Not on file  Occupational History  . Not on file  Social Needs  . Financial resource strain: Not on file  . Food insecurity:    Worry: Not on file    Inability: Not on file  . Transportation needs:    Medical: Not on file    Non-medical: Not on file  Tobacco Use  . Smoking status: Current Every Day Smoker    Packs/day: 0.50    Types: Cigarettes  . Smokeless tobacco: Never Used  Substance and Sexual Activity  . Alcohol use: Yes  . Drug use: Not Currently  . Sexual activity: Not on file  Lifestyle  . Physical activity:    Days per week: Not on file    Minutes per session: Not on file  . Stress: Not on file  Relationships  . Social connections:    Talks   on phone: Not on file    Gets together: Not on file    Attends religious service: Not on file    Active member of club or organization: Not on file    Attends meetings of clubs or organizations: Not on file    Relationship status: Not on file  . Intimate partner violence:    Fear of current or ex partner: Not on file    Emotionally abused: Not on file    Physically abused: Not on file    Forced sexual activity: Not on file  Other Topics Concern   . Not on file  Social History Narrative  . Not on file    Family History  Problem Relation Age of Onset  . Kidney cancer Maternal Grandmother     ROS: No fever, myalgias/arthralgias No new focal weakness or sensory deficits No otalgia, hearing loss, visual changes, nasal and sinus symptoms, mouth and throat problems No neck pain or adenopathy No abdominal pain, N/V/D, diarrhea, change in bowel pattern No dysuria, change in urinary pattern   Vitals:   02/25/18 1414  BP: (!) 160/100  Pulse: (!) 108  SpO2: 97%  Weight: 252 lb 9.6 oz (114.6 kg)  Height: 5' 7" (1.702 m)  Room air  EXAM:  Gen: Moderately obese, No overt respiratory distress HEENT: NCAT, sclera white, oropharynx normal LN survey: There is no palpable adenopathy in the neck, axillae or groin regions Neck: Supple without LAN, thyromegaly, JVD Lungs: breath sounds slightly coarse without wheezes or other adventitious sounds, percussion is normal throughout Cardiovascular: RRR, no murmurs noted Abdomen: Soft, nontender, normal BS Ext: without clubbing, cyanosis, edema Neuro: CNs grossly intact, motor and sensory intact Skin: Limited exam, no lesions noted  DATA:   BMP Latest Ref Rng & Units 01/21/2018 11/08/2017 08/25/2017  Glucose 70 - 99 mg/dL 95 96 103(H)  BUN 6 - 20 mg/dL 9 12 15  Creatinine 0.61 - 1.24 mg/dL 1.05 1.14 1.05  Sodium 135 - 145 mmol/L 138 139 140  Potassium 3.5 - 5.1 mmol/L 3.8 3.8 3.7  Chloride 98 - 111 mmol/L 105 103 107  CO2 22 - 32 mmol/L 26 28 27  Calcium 8.9 - 10.3 mg/dL 8.8(L) 9.0 8.9    CBC Latest Ref Rng & Units 01/21/2018 11/08/2017 08/25/2017  WBC 4.0 - 10.5 K/uL 4.0 5.3 5.7  Hemoglobin 13.0 - 17.0 g/dL 13.9 14.3 13.5  Hematocrit 39.0 - 52.0 % 43.1 41.7 40.0  Platelets 150 - 400 K/uL 272 244 254    CXR: As documented above  I have personally reviewed all chest radiographs reported above including CXRs and CT chest unless otherwise indicated  IMPRESSION:     ICD-10-CM   1.  Mediastinal lymphadenopathy R59.0   2. Bilateral hilar lymphadenopathy R59.0   3. Pulmonary infiltrates R91.8   4. Smoker F17.200    The CT scan findings represent either lymphoma or sarcoidosis.  Given his clinical presentation, I think sarcoidosis is more likely.  Given the extent of abnormality noted on CT scan, either diagnosis should be readily made with bronchoscopy.  PLAN:  We discussed diagnostic options.  We discussed conventional bronchoscopy versus EBUS.  We discussed the indication for the procedure, alternatives (there really are not any), risks in detail.  I offered the option that I could set him up with one of my partners for EBUS which might have a slightly higher diagnostic yield.  He wishes to proceed with conventional bronchoscopy.  He wishes to do this at a   time that he is already scheduled to be off of work next week.  Bronchoscopy has been scheduled for January 16.  I intend to perform transbronchial needle aspiration and transbronchial biopsies of the right lower lobe.  I will contact him with results of bronchoscopy biopsies.  I strongly urged him to quit smoking.  He will follow-up in this office in 3 to 4 weeks.   All of his questions were answered in detail.  Shaughnessy Gethers, MD PCCM service Mobile [phone] Pager [phone] 02/25/2018 4:16 PM  

## 2018-02-25 NOTE — Patient Instructions (Signed)
Bronchoscopy will be scheduled for January 16th I will contact you with results of the biopsy Follow-up in this office in 3 to 4 weeks  To the employer of [employer]: Mr Gongwer was seen in my office for a medical appointment on the afternoon of 02/25/2018 and should be excused for work absence.  Thank you

## 2018-02-25 NOTE — Progress Notes (Signed)
PULMONARY CONSULT NOTE  Requesting MD/Service: Orlie Dakin Date of initial consultation: 02/25/2018 Reason for consultation: Bulky hilar and mediastinal adenopathy and bilateral pulmonary infiltrates worrisome for sarcoidosis versus lymphoma  [name]  DATA: 05/03/16 CXR: No acute cardiac or pulmonary findings 11/08/17 CXR: Bilateral hilar fullness.  Scattered bilateral pulmonary opacities most prominent in RLL 12/21/17 CXR: No significant change bilateral hilar fullness.  Increased paratracheal lymph node.  Partial clearing of RLL opacity 01/21/18 CXR: No significant change to slight progression of bilateral hilar fullness and paratracheal opacity.  Persistent vague RLL opacity 01/21/18 CT chest: Bulky hilar and mediastinal adenopathy.  Patchy bilateral pulmonary infiltrates most prominently in RLL and LUL  INTERVAL:  HPI:  4 months ago he presented to [facility] ED with cough, chest tightness and rattling sensation in his chest.  His cough was nonproductive.  He had no definite fevers.  He had minimal shortness of breath which was exacerbated by extreme cold.  Chest x-ray revealed findings as documented above (11/08/2017).  He was treated with antibiotics.  His symptoms did not improve and he presented again in early November.  He was again treated with antibiotics.  His symptoms persisted and he again presented to [facility] ED in early December with a repeat chest x-ray demonstrating similar findings.  Because of the persistent chest x-ray findings a CT scan of the chest was performed with findings as documented above.  Based on CT scan of the chest the concern has been raised for lymphoma.  He was referred to oncology, presented at oncology conference last week and referred for me for diagnostic bronchoscopy.  At the present time, he continues to have cough and shortness of  breath exacerbated by extreme cold.  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  This is when he notices shortness of breath and chest tightness.  Overall, his shortness of breath is rather minimal.  He has lost 60 pounds in the past year, some of it intentionally.  He denies fever.  He does have occasional night sweats.  He has no other lymphadenopathy.  He denies significant fatigue.  History reviewed. No pertinent past medical history.  History reviewed. No pertinent surgical history.  MEDICATIONS: I have reviewed all medications and confirmed regimen as documented  Social History   Socioeconomic History  . Marital status: Single    Spouse name: Not on file  . Number of children: Not on file  . Years of education: Not on file  . Highest education level: Not on file  Occupational History  . Not on file  Social Needs  . Financial resource strain: Not on file  . Food insecurity:    Worry: Not on file    Inability: Not on file  . Transportation needs:    Medical: Not on file    Non-medical: Not on file  Tobacco Use  . Smoking status: Current Every Day Smoker    Packs/day: 0.50    Types: Cigarettes  . Smokeless tobacco: Never Used  Substance and Sexual Activity  . Alcohol use: Yes  . Drug use: Not Currently  . Sexual activity: Not on file  Lifestyle  . Physical activity:    Days per week: Not on file    Minutes per session: Not on file  . Stress: Not on file  Relationships  . Social connections:    Talks  on phone: Not on file    Gets together: Not on file    Attends religious service: Not on file    Active member of club or organization: Not on file    Attends meetings of clubs or organizations: Not on file    Relationship status: Not on file  . Intimate partner violence:    Fear of current or ex partner: Not on file    Emotionally abused: Not on file    Physically abused: Not on file    Forced sexual activity: Not on file  Other Topics Concern   . Not on file  Social History Narrative  . Not on file    Family History  Problem Relation Age of Onset  . Kidney cancer Maternal Grandmother     ROS: No fever, myalgias/arthralgias No new focal weakness or sensory deficits No otalgia, hearing loss, visual changes, nasal and sinus symptoms, mouth and throat problems No neck pain or adenopathy No abdominal pain, N/V/D, diarrhea, change in bowel pattern No dysuria, change in urinary pattern   Vitals:   02/25/18 1414  BP: (!) 160/100  Pulse: (!) 108  SpO2: 97%  Weight: 252 lb 9.6 oz (114.6 kg)  Height: 5\' 7"  (1.702 m)  Room air  EXAM:  Gen: Moderately obese, No overt respiratory distress HEENT: NCAT, sclera white, oropharynx normal LN survey: There is no palpable adenopathy in the neck, axillae or groin regions Neck: Supple without LAN, thyromegaly, JVD Lungs: breath sounds slightly coarse without wheezes or other adventitious sounds, percussion is normal throughout Cardiovascular: RRR, no murmurs noted Abdomen: Soft, nontender, normal BS Ext: without clubbing, cyanosis, edema Neuro: CNs grossly intact, motor and sensory intact Skin: Limited exam, no lesions noted  DATA:   BMP Latest Ref Rng & Units 01/21/2018 11/08/2017 08/25/2017  Glucose 70 - 99 mg/dL 95 96 505(X)  BUN 6 - 20 mg/dL 9 12 15   Creatinine 0.61 - 1.24 mg/dL 8.33 5.82 5.18  Sodium 135 - 145 mmol/L 138 139 140  Potassium 3.5 - 5.1 mmol/L 3.8 3.8 3.7  Chloride 98 - 111 mmol/L 105 103 107  CO2 22 - 32 mmol/L 26 28 27   Calcium 8.9 - 10.3 mg/dL 9.8(M) 9.0 8.9    CBC Latest Ref Rng & Units 01/21/2018 11/08/2017 08/25/2017  WBC 4.0 - 10.5 K/uL 4.0 5.3 5.7  Hemoglobin 13.0 - 17.0 g/dL 21.0 31.2 81.1  Hematocrit 39.0 - 52.0 % 43.1 41.7 40.0  Platelets 150 - 400 K/uL 272 244 254    CXR: As documented above  I have personally reviewed all chest radiographs reported above including CXRs and CT chest unless otherwise indicated  IMPRESSION:     ICD-10-CM   1.  Mediastinal lymphadenopathy R59.0   2. Bilateral hilar lymphadenopathy R59.0   3. Pulmonary infiltrates R91.8   4. Smoker F17.200    The CT scan findings represent either lymphoma or sarcoidosis.  Given his clinical presentation, I think sarcoidosis is more likely.  Given the extent of abnormality noted on CT scan, either diagnosis should be readily made with bronchoscopy.  PLAN:  We discussed diagnostic options.  We discussed conventional bronchoscopy versus EBUS.  We discussed the indication for the procedure, alternatives (there really are not any), risks in detail.  I offered the option that I could set him up with one of my partners for EBUS which might have a slightly higher diagnostic yield.  He wishes to proceed with conventional bronchoscopy.  He wishes to do this at a  time that he is already scheduled to be off of work next week.  Bronchoscopy has been scheduled for January 16.  I intend to perform transbronchial needle aspiration and transbronchial biopsies of the right lower lobe.  I will contact him with results of bronchoscopy biopsies.  I strongly urged him to quit smoking.  He will follow-up in this office in 3 to 4 weeks.   All of his questions were answered in detail.  Patrick Fischeravid Marsel Gail, MD PCCM service Mobile [phone] Pager [phone] 02/25/2018 4:16 PM

## 2018-02-26 ENCOUNTER — Telehealth: Payer: Self-pay

## 2018-02-26 NOTE — Telephone Encounter (Signed)
Date: 03/07/18 Procedure: Reg Bronch CPT: 75643 Dx: Mediastinal lymphadenopathy

## 2018-03-07 ENCOUNTER — Ambulatory Visit: Payer: BLUE CROSS/BLUE SHIELD

## 2018-03-07 ENCOUNTER — Encounter
Admission: RE | Disposition: A | Discharge status: Home / Self Care | Payer: Self-pay | Source: Home / Self Care | Attending: Pulmonary Disease

## 2018-03-07 ENCOUNTER — Other Ambulatory Visit: Payer: Self-pay

## 2018-03-07 ENCOUNTER — Ambulatory Visit
Admission: RE | Admit: 2018-03-07 | Discharge: 2018-03-07 | Disposition: A | Discharge status: Home / Self Care | Payer: BLUE CROSS/BLUE SHIELD | Attending: Pulmonary Disease | Admitting: Pulmonary Disease

## 2018-03-07 ENCOUNTER — Encounter: Payer: Self-pay | Admitting: *Deleted

## 2018-03-07 DIAGNOSIS — F17210 Nicotine dependence, cigarettes, uncomplicated: Secondary | ICD-10-CM | POA: Diagnosis not present

## 2018-03-07 DIAGNOSIS — J40 Bronchitis, not specified as acute or chronic: Secondary | ICD-10-CM | POA: Insufficient documentation

## 2018-03-07 DIAGNOSIS — R590 Localized enlarged lymph nodes: Secondary | ICD-10-CM | POA: Diagnosis not present

## 2018-03-07 DIAGNOSIS — R918 Other nonspecific abnormal finding of lung field: Secondary | ICD-10-CM | POA: Diagnosis not present

## 2018-03-07 DIAGNOSIS — Z9889 Other specified postprocedural states: Secondary | ICD-10-CM

## 2018-03-07 HISTORY — PX: FLEXIBLE BRONCHOSCOPY: SHX5094

## 2018-03-07 SURGERY — BRONCHOSCOPY, FLEXIBLE
Anesthesia: LOCAL

## 2018-03-07 MED ORDER — PHENYLEPHRINE HCL 0.25 % NA SOLN
1.0000 | Freq: Four times a day (QID) | NASAL | Status: DC | PRN
  Filled 2018-03-07: qty 15

## 2018-03-07 MED ORDER — FENTANYL CITRATE (PF) 100 MCG/2ML IJ SOLN
INTRAMUSCULAR | Status: DC | PRN
  Administered 2018-03-07: 50 ug via INTRAVENOUS

## 2018-03-07 MED ORDER — BUTAMBEN-TETRACAINE-BENZOCAINE 2-2-14 % EX AERO
1.0000 | INHALATION_SPRAY | Freq: Once | CUTANEOUS | Status: DC
  Filled 2018-03-07: qty 20

## 2018-03-07 MED ORDER — MIDAZOLAM HCL 2 MG/2ML IJ SOLN
INTRAMUSCULAR | Status: DC | PRN
  Administered 2018-03-07: 2 mg via INTRAVENOUS
  Administered 2018-03-07: 4 mg via INTRAVENOUS

## 2018-03-07 MED ORDER — MIDAZOLAM HCL 2 MG/2ML IJ SOLN
INTRAMUSCULAR | Status: AC
  Filled 2018-03-07: qty 10

## 2018-03-07 MED ORDER — FENTANYL CITRATE (PF) 100 MCG/2ML IJ SOLN
INTRAMUSCULAR | Status: AC
  Filled 2018-03-07: qty 4

## 2018-03-07 MED ORDER — LIDOCAINE HCL URETHRAL/MUCOSAL 2 % EX GEL
1.0000 "application " | Freq: Once | CUTANEOUS | Status: DC
  Filled 2018-03-07: qty 30

## 2018-03-07 NOTE — Discharge Instructions (Signed)
Flexible Bronchoscopy, Care After This sheet gives you information about how to care for yourself after your procedure. Your health care provider may also give you more specific instructions. If you have problems or questions, contact your health care provider. What can I expect after the procedure? After the procedure, it is common to have the following symptoms for 24-48 hours:  A cough that is worse than it was before the procedure.  A low-grade fever.  A sore throat or hoarse voice.  Small streaks of blood in the mucus from your lungs (sputum), if tissue samples were removed (biopsy). Follow these instructions at home: Eating and drinking  Do not eat or drink anything (including water) for 2 hours after your procedure, or until your numbing medicine (local anesthetic) has worn off. Having a numb throat increases your risk of burning yourself or choking.  After your numbness is gone and your cough and gag reflexes have returned, you may start eating only soft foods and slowly drinking liquids.  The day after the procedure, return to your normal diet. Driving  Do not drive for 24 hours if you were given a medicine to help you relax (sedative).  Do not drive or use heavy machinery while taking prescription pain medicine. General instructions   Take over-the-counter and prescription medicines only as told by your health care provider.  Return to your normal activities as told by your health care provider. Ask your health care provider what activities are safe for you.  Do not use any products that contain nicotine or tobacco, such as cigarettes and e-cigarettes. If you need help quitting, ask your health care provider.  Keep all follow-up visits as told by your health care provider. This is important, especially if you had a biopsy taken. Get help right away if:  You have shortness of breath that gets worse.  You become light-headed or feel like you might faint.  You have  chest pain.  You cough up more than a small amount of blood.  The amount of blood you cough up increases. Summary  Common symptoms in the 24-48 hours following a flexible bronchoscopy include cough, low-grade fever, sore throat or hoarse voice, and blood-streaked mucus from the lungs (if you had a biopsy).  Do not eat or drink anything (including water) for 2 hours after your procedure, or until your local anesthetic has worn off. You can return to your normal diet the day after the procedure.  Get help right away if you develop worsening shortness of breath, have chest pain, become light-headed, or cough up more than a small amount of blood. This information is not intended to replace advice given to you by your health care provider. Make sure you discuss any questions you have with your health care provider. Document Released: 08/26/2004 Document Revised: 02/25/2016 Document Reviewed: 02/25/2016 Elsevier Interactive Patient Education  2019 [employer].

## 2018-03-07 NOTE — Interval H&P Note (Signed)
History and Physical Interval Note:  03/07/2018 12:14 PM  Patrick [name]  has presented today for surgery, with the diagnosis of MEDIASTINAL LYMPHADENOPATHY  The various methods of treatment have been discussed with the patient and family. After consideration of risks, benefits and other options for treatment, the patient has consented to  Procedure(s): FLEXIBLE BRONCHOSCOPY (N/A) as a surgical intervention .  The patient's history has been reviewed, patient examined, no change in status, stable for surgery.  I have reviewed the patient's chart and labs.  Questions were answered to the patient's satisfaction.     Wilhelmina Mcardle

## 2018-03-07 NOTE — Procedures (Signed)
Indication:   Bulky mediastinal adenopathy, extensive pulmonary infiltrates  Premedication:   Fentanyl 50 mcg Midaz 4+2 mg  Anesthesia: Topical to nose and throat 40 cc of 1% lidocaine used during the course of procedure  Procedure: After adequate sedation and anesthesia, the bronchoscope was introduced via the R naris and advanced into the posterior pharynx. Further anesthesia was obtained with 1% lidocaine and the scope was advanced into the trachea. Complete airway anesthesia was achieved with 1% lidocaine and a thorough airway examination was performed. This revealed the following findings:  Findings:  Upper airway - normal Tracheobronchial anatomy - normal variant Bronchial mucosa - mild diffuse bronchitis Other - no endobronchial masses, tumors, foreign bodies  Specimens:   Transbronchial needle x 3 passes sent for cytology Transbronchial biopsy (with flouroscopic guidance) from RLL sent for path and flow cytometry BAL from RML sent for cytology Cytology brushings from RLL for cytology   Complications: None  Post procedure evaluation:  The patient tolerated the procedure well with no major complications CXR - pending   Merton Border, MD PCCM service Mobile [phone] Pager [phone] 03/07/2018 12:19 PM

## 2018-03-08 ENCOUNTER — Encounter: Payer: Self-pay | Admitting: Pulmonary Disease

## 2018-03-08 LAB — CYTOLOGY - NON PAP

## 2018-03-11 LAB — CYTOLOGY - NON PAP

## 2018-03-11 LAB — SURGICAL PATHOLOGY

## 2018-03-18 ENCOUNTER — Ambulatory Visit (INDEPENDENT_AMBULATORY_CARE_PROVIDER_SITE_OTHER): Payer: BLUE CROSS/BLUE SHIELD | Admitting: Pulmonary Disease

## 2018-03-18 ENCOUNTER — Encounter: Payer: Self-pay | Admitting: Pulmonary Disease

## 2018-03-18 VITALS — BP 148/88 | HR 98 | Ht 67.0 in | Wt 251.6 lb

## 2018-03-18 DIAGNOSIS — F17200 Nicotine dependence, unspecified, uncomplicated: Secondary | ICD-10-CM

## 2018-03-18 DIAGNOSIS — E507 Other ocular manifestations of vitamin A deficiency: Secondary | ICD-10-CM

## 2018-03-18 DIAGNOSIS — D860 Sarcoidosis of lung: Secondary | ICD-10-CM | POA: Diagnosis not present

## 2018-03-18 MED ORDER — PREDNISONE 10 MG PO TABS: 10.0000 mg | ORAL_TABLET | Freq: Every day | ORAL | 5 refills | Status: DC

## 2018-03-18 NOTE — Patient Instructions (Addendum)
We will complete the evaluation for sarcoidosis with the following tests: EKG Pulmonary function tests this week Blood test today: HIV, ACE level  You should get eye examination to evaluate for possible ophthalmic (eye) involvement with sarcoidosis  After your pulmonary function tests (lung function measurement) are performed initiate prednisone as follows: 40 mg (4 tablets) daily for 1 week,  Then 30 mg (3 tablets) daily for 1 week,  Then 20 mg (2 tablets) daily for 1 week,  Then 10 mg daily until seen in follow-up  Smoking cessation as we discussed Follow-up in 6 weeks with repeat chest x-ray prior to that visit

## 2018-03-18 NOTE — Progress Notes (Signed)
PULMONARY OFFICE FOLLOW-UP NOTE  Requesting MD/Service: Grayland Ormond Date of initial consultation: 02/25/2018 Reason for consultation: Bulky hilar and mediastinal adenopathy and bilateral pulmonary infiltrates worrisome for sarcoidosis versus lymphoma  PT PROFILE: 28 y.o. male smoker with 22-monthhistory of abnormal chest radiographs and CT scan raising concern for lymphoma (versus sarcoidosis)  DATA: 05/03/16 CXR: No acute cardiac or pulmonary findings 11/08/17 CXR: Bilateral hilar fullness.  Scattered bilateral pulmonary opacities most prominent in RLL 12/21/17 CXR: No significant change bilateral hilar fullness.  Increased paratracheal lymph node.  Partial clearing of RLL opacity 01/21/18 CXR: No significant change to slight progression of bilateral hilar fullness and paratracheal opacity.  Persistent vague RLL opacity 01/21/18 CT chest: Bulky hilar and mediastinal adenopathy.  Patchy bilateral pulmonary infiltrates most prominently in RLL and LUL 03/07/18 bronchoscopy: Airway examination was notable only for mild diffuse bronchitis.  Transbronchial needle aspiration, BAL and cytology brushings were all negative for malignancy.  Transbronchial biopsy demonstrated nonnecrotizing granulomatous inflammation consistent with sarcoidosis  INTERVAL:  SUBJ:  He is here to review the results of bronchoscopy and implications of biopsy findings.  He has no new complaints.  He continues to have mild exertional dyspnea exacerbated by exposure to a severe cold.  He continues to smoke approximately 3 cigarettes/day.  He denies CP, fever, purulent sputum, hemoptysis, LE edema and calf tenderness.  He does report occasional dry eyes.  He denies arthralgias and skin rashes.  He has no neurologic symptoms.  He denies palpitations.  He has no symptoms of congestive heart failure.   Vitals:   03/18/18 1357  BP: (!) 148/88  Pulse: 98  SpO2: 97%  Weight: 251 lb 9.6 oz (114.1 kg)  Height: _0  (1.702 m)  Room  air  EXAM:  Gen: Moderately obese, NAD HEENT: NCAT, sclera white Neck: No JVD Lungs: breath sounds full, no wheezes or other adventitious sounds Cardiovascular: RRR, no murmurs Abdomen: Soft, nontender, normal BS Ext: without clubbing, cyanosis, edema Neuro: grossly intact Skin: Limited exam, no lesions noted   DATA:   BMP Latest Ref Rng & Units 01/21/2018 11/08/2017 08/25/2017  Glucose 70 - 99 mg/dL 95 96 103(H)  BUN 6 - 20 mg/dL _1 Creatinine 0.61 - 1.24 mg/dL 1.05 1.14 1.05  Sodium 135 - 145 mmol/L 138 139 140  Potassium 3.5 - 5.1 mmol/L 3.8 3.8 3.7  Chloride 98 - 111 mmol/L 105 103 107  CO2 22 - 32 mmol/L _2 Calcium 8.9 - 10.3 mg/dL 8.8(L) 9.0 8.9    CBC Latest Ref Rng & Units 01/21/2018 11/08/2017 08/25/2017  WBC 4.0 - 10.5 K/uL 4.0 5.3 5.7  Hemoglobin 13.0 - 17.0 g/dL 13.9 14.3 13.5  Hematocrit 39.0 - 52.0 % 43.1 41.7 40.0  Platelets 150 - 400 K/uL 272 244 254    CXR 03/07/18: This film was performed after bronchoscopy.  There was no pneumothorax.  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 Smoker F17.200    We discussed in  detail the implications of this new diagnosis.  We discussed treatment options.  I believe that he has significant enough pulmonary and lymph node involvement that it warrants treatment.  However, I cautioned him regarding the risk of weight gain on prednisone.  I provided him with a monograph off of UpTo[employer] summarizing the basics of sarcoidosis  and we reviewed it together.  PLAN:  First and foremost, I emphasized that he must quit smoking  We will complete the evaluation for sarcoidosis with the following tests: EKG, PFTs, HIV serology, ACE level.  He has already had LFTs performed which are normal.  His calcium level is normal.  After PFTs are performed, will initiate prednisone 40 mg daily for 1 week, 30 mg daily for 1 week, 20 mg daily for 1 week, 10 mg daily until seen in follow-up  I have requested ophthalmology evaluation to evaluate dry eyes in the setting of a new diagnosis of sarcoidosis to make sure that he does not have ophthalmic involvement  Follow-up in 6 weeks with repeat chest x-ray prior to that visit.  Call sooner if needed    Merton Border, MD PCCM service Mobile [phone] Pager [phone] 03/18/2018 4:36 PM

## 2018-03-21 ENCOUNTER — Ambulatory Visit: Payer: BLUE CROSS/BLUE SHIELD | Attending: Pulmonary Disease

## 2018-04-11 ENCOUNTER — Other Ambulatory Visit: Payer: Self-pay

## 2018-04-11 ENCOUNTER — Emergency Department: Payer: BLUE CROSS/BLUE SHIELD

## 2018-04-11 ENCOUNTER — Encounter: Payer: Self-pay | Admitting: *Deleted

## 2018-04-11 ENCOUNTER — Emergency Department
Admission: EM | Admit: 2018-04-11 | Discharge: 2018-04-11 | Disposition: A | Discharge status: Home / Self Care | Payer: BLUE CROSS/BLUE SHIELD | Attending: Student in an Organized Health Care Education/Training Program | Admitting: Student in an Organized Health Care Education/Training Program

## 2018-04-11 DIAGNOSIS — F17210 Nicotine dependence, cigarettes, uncomplicated: Secondary | ICD-10-CM | POA: Diagnosis not present

## 2018-04-11 DIAGNOSIS — M545 Low back pain, unspecified: Secondary | ICD-10-CM

## 2018-04-11 DIAGNOSIS — Z79899 Other long term (current) drug therapy: Secondary | ICD-10-CM | POA: Diagnosis not present

## 2018-04-11 MED ORDER — CYCLOBENZAPRINE HCL 5 MG PO TABS: ORAL_TABLET | ORAL | 0 refills | Status: DC

## 2018-04-11 MED ORDER — IBUPROFEN 600 MG PO TABS: 600.0000 mg | ORAL_TABLET | Freq: Four times a day (QID) | ORAL | 0 refills | Status: DC | PRN

## 2018-04-11 MED ORDER — IBUPROFEN 600 MG PO TABS
600.0000 mg | ORAL_TABLET | Freq: Once | ORAL | Status: AC
  Administered 2018-04-11: 600 mg via ORAL
  Filled 2018-04-11: qty 1

## 2018-04-11 MED ORDER — CYCLOBENZAPRINE HCL 10 MG PO TABS
5.0000 mg | ORAL_TABLET | Freq: Once | ORAL | Status: AC
  Administered 2018-04-11: 5 mg via ORAL
  Filled 2018-04-11: qty 1

## 2018-04-11 NOTE — ED Triage Notes (Signed)
Pt reporting sudden onset of lower back pain yesterday when lifting a pallet at work. Pain worse with movement and ambulating. No numbness in legs or other trauma.

## 2018-04-11 NOTE — ED Notes (Signed)
Patient transported to X-ray 

## 2018-04-11 NOTE — ED Provider Notes (Signed)
[facility] Emergency Department Provider Note  ____________________________________________  Time seen: Approximately 5:29 PM  I have reviewed the triage vital signs and the nursing notes.   HISTORY  Chief Complaint Back Pain    HPI Patrick [name] is a 28 y.o. male that presents to the emergency department for evaluation of low back pain for 1 day. He was wrapping a palate at work when he felt the low back pain. Pain is worse in the center and radiates to both sides. No bowel or bladder dysfunction or saddle anesthesia. No nausea, vomiting, abdominal pain.     History reviewed. No pertinent past medical history.  Patient Active Problem List   Diagnosis Date Noted  . Mediastinal lymphadenopathy 02/15/2018    Past Surgical History:  Procedure Laterality Date  . FLEXIBLE BRONCHOSCOPY N/A 03/07/2018   Procedure: FLEXIBLE BRONCHOSCOPY;  Surgeon: Wilhelmina Mcardle, MD;  Location: [facility] ORS;  Service: Pulmonary;  Laterality: N/A;    Prior to Admission medications   Medication Sig Start Date End Date Taking? Authorizing Provider  cyclobenzaprine (FLEXERIL) 5 MG tablet Take 1-2 tablets 3 times daily as needed 04/11/18   Laban Emperor, PA-C  hydrochlorothiazide (HYDRODIURIL) 25 MG tablet Take 1 tablet (25 mg total) by mouth daily. 07/15/17   Harvest Dark, MD  ibuprofen (ADVIL,MOTRIN) 600 MG tablet Take 1 tablet (600 mg total) by mouth every 6 (six) hours as needed. 04/11/18   Laban Emperor, PA-C  predniSONE (DELTASONE) 10 MG tablet Take 1 tablet (10 mg total) by mouth daily with breakfast. 03/18/18   Wilhelmina Mcardle, MD    Allergies Patient has no known allergies.  Family History  Problem Relation Age of Onset  . Kidney cancer Maternal Grandmother     Social History Social History   Tobacco Use  . Smoking status: Current Every Day Smoker    Packs/day: 0.50    Types: Cigarettes  . Smokeless tobacco: Never Used  Substance Use Topics  . Alcohol  use: Yes  . Drug use: Not Currently     Review of Systems  Constitutional: No fever/chills Cardiovascular: No chest pain. Respiratory: No SOB. Gastrointestinal: No abdominal pain.  No nausea, no vomiting.  Musculoskeletal: Positive for back pain Skin: Negative for rash, abrasions, lacerations, ecchymosis. Neurological: Negative for headaches, numbness or tingling   ____________________________________________   PHYSICAL EXAM:  VITAL SIGNS: ED Triage Vitals  Enc Vitals Group     BP 04/11/18 1650 (!) 156/99     Pulse Rate 04/11/18 1650 (!) 115     Resp 04/11/18 1650 20     Temp 04/11/18 1650 98.1 F (36.7 C)     Temp Source 04/11/18 1650 Oral     SpO2 04/11/18 1650 97 %     Weight 04/11/18 1650 248 lb (112.5 kg)     Height 04/11/18 1650 _0  (1.702 m)     Head Circumference --      Peak Flow --      Pain Score 04/11/18 1653 10     Pain Loc --      Pain Edu? --      Excl. in [place]? --      Constitutional: Alert and oriented. Well appearing and in no acute distress. Eyes: Conjunctivae are normal. PERRL. EOMI. Head: Atraumatic. ENT:      Ears:      Nose: No congestion/rhinnorhea.      Mouth/Throat: Mucous membranes are moist.  Neck: No stridor.   Cardiovascular: Normal rate, regular rhythm.  Good peripheral circulation. Respiratory: Normal respiratory effort without tachypnea or retractions. Lungs CTAB. Good air entry to the bases with no decreased or absent breath sounds. Musculoskeletal: Full range of motion to all extremities. No gross deformities appreciated.  Mild diffuse tenderness to palpation to lumbar spine and lumbar paraspinal muscles.  Negative straight leg raise. Neurologic:  Normal speech and language. No gross focal neurologic deficits are appreciated.  Skin:  Skin is warm, dry and intact. No rash noted. Psychiatric: Mood and affect are normal. Speech and behavior are normal. Patient exhibits appropriate insight and  judgement.   ____________________________________________   LABS (all labs ordered are listed, but only abnormal results are displayed)  Labs Reviewed - No data to display ____________________________________________  EKG   ____________________________________________  RADIOLOGY Robinette Haines, personally viewed and evaluated these images (plain radiographs) as part of my medical decision making, as well as reviewing the written report by the radiologist.  Dg Lumbar Spine 2-3 Views  Result Date: 04/11/2018 CLINICAL DATA:  Sudden onset low back pain yesterday lifting a Pallet at work. EXAM: LUMBAR SPINE - 2-3 VIEW COMPARISON:  CT 06/08/2017 FINDINGS: Vertebral body alignment and heights are normal. Subtle disc space narrowing at the L4-5 level. No evidence of compression fracture or spondylolisthesis. IMPRESSION: No acute findings. Subtle disc space narrowing at the L4-5 level. Electronically Signed   By: Marin Olp M.D.   On: 04/11/2018 18:00    ____________________________________________    PROCEDURES  Procedure(s) performed:    Procedures    Medications  cyclobenzaprine (FLEXERIL) tablet 5 mg (5 mg Oral Given 04/11/18 1836)  ibuprofen (ADVIL,MOTRIN) tablet 600 mg (600 mg Oral Given 04/11/18 1836)     ____________________________________________   INITIAL IMPRESSION / ASSESSMENT AND PLAN / ED COURSE  Pertinent labs & imaging results that were available during my care of the patient were reviewed by me and considered in my medical decision making (see chart for details).  Review of the [place] CSRS was performed in accordance of the [place] prior to dispensing any controlled drugs.     Patient presented the emergency department for evaluation of low back pain.  X-ray negative for acute abnormalities.  Patient declines IM medications.  Patient will be discharged home with prescriptions for Flexeril and Motrin. Patient is to follow up with primary care as directed.  Patient is given ED precautions to return to the ED for any worsening or new symptoms.     ____________________________________________  FINAL CLINICAL IMPRESSION(S) / ED DIAGNOSES  Final diagnoses:  Acute midline low back pain without sciatica      NEW MEDICATIONS STARTED DURING THIS VISIT:  ED Discharge Orders         Ordered    cyclobenzaprine (FLEXERIL) 5 MG tablet     04/11/18 1835    ibuprofen (ADVIL,MOTRIN) 600 MG tablet  Every 6 hours PRN     04/11/18 1835              This chart was dictated using voice recognition software/Dragon. Despite best efforts to proofread, errors can occur which can change the meaning. Any change was purely unintentional.    Laban Emperor, PA-C 04/11/18 1937    Merlyn Lot, MD 04/11/18 [id number]

## 2018-04-22 ENCOUNTER — Telehealth: Payer: Self-pay

## 2018-04-22 NOTE — Telephone Encounter (Signed)
LM for patient that Dr. Sung Amabile wanted him to get PFT before apt. He was a ns for his PFT. Will need rescheduled and f/u rescheduled.

## 2018-04-24 ENCOUNTER — Ambulatory Visit: Payer: BLUE CROSS/BLUE SHIELD | Admitting: Pulmonary Disease

## 2018-05-03 ENCOUNTER — Telehealth: Payer: Self-pay

## 2018-05-03 DIAGNOSIS — D860 Sarcoidosis of lung: Secondary | ICD-10-CM

## 2018-05-03 NOTE — Telephone Encounter (Signed)
LM for patient to call and schedule f/u apt with Dr. Sung Amabile. He will need CXR prior. Orders placed.

## 2018-06-05 ENCOUNTER — Other Ambulatory Visit: Payer: Self-pay

## 2018-06-05 ENCOUNTER — Emergency Department
Admission: EM | Admit: 2018-06-05 | Discharge: 2018-06-05 | Disposition: A | Discharge status: Home / Self Care | Payer: BLUE CROSS/BLUE SHIELD | Attending: Emergency Medicine | Admitting: Emergency Medicine

## 2018-06-05 ENCOUNTER — Encounter: Payer: Self-pay | Admitting: Emergency Medicine

## 2018-06-05 DIAGNOSIS — F17210 Nicotine dependence, cigarettes, uncomplicated: Secondary | ICD-10-CM | POA: Insufficient documentation

## 2018-06-05 DIAGNOSIS — M79605 Pain in left leg: Secondary | ICD-10-CM | POA: Diagnosis present

## 2018-06-05 DIAGNOSIS — M5432 Sciatica, left side: Secondary | ICD-10-CM

## 2018-06-05 MED ORDER — CYCLOBENZAPRINE HCL 10 MG PO TABS: 10.0000 mg | ORAL_TABLET | Freq: Three times a day (TID) | ORAL | 0 refills | Status: DC | PRN

## 2018-06-05 MED ORDER — PREDNISONE 10 MG (21) PO TBPK: ORAL_TABLET | ORAL | 0 refills | Status: DC

## 2018-06-05 NOTE — ED Provider Notes (Signed)
[facility] Emergency Department Provider Note  ____________________________________________   First MD Initiated Contact with Patient 06/05/18 1045     (approximate)  I have reviewed the triage vital signs and the nursing notes.   HISTORY  Chief Complaint Leg Pain    HPI Patrick [name] is a 28 y.o. male presents emergency department with pain in the left leg. C/o low back pain for 2 day, no known injury, pain is worse with movement, increased with bending over, states pain in the left leg is more of a  tingling sensation, denies changes in bowel/urinary habits,  Using otc meds without relief Remainder ros neg   History reviewed. No pertinent past medical history.  Patient Active Problem List   Diagnosis Date Noted  . Mediastinal lymphadenopathy 02/15/2018    Past Surgical History:  Procedure Laterality Date  . FLEXIBLE BRONCHOSCOPY N/A 03/07/2018   Procedure: FLEXIBLE BRONCHOSCOPY;  Surgeon: Wilhelmina Mcardle, MD;  Location: [facility] ORS;  Service: Pulmonary;  Laterality: N/A;    Prior to Admission medications   Medication Sig Start Date End Date Taking? Authorizing Provider  cyclobenzaprine (FLEXERIL) 10 MG tablet Take 1 tablet (10 mg total) by mouth 3 (three) times daily as needed. 06/05/18   Fisher, Linden Dolin, PA-C  predniSONE (STERAPRED UNI-PAK 21 TAB) 10 MG (21) TBPK tablet Take 6 pills on day one then decrease by 1 pill each day 06/05/18   Versie Starks, PA-C    Allergies Patient has no known allergies.  Family History  Problem Relation Age of Onset  . Kidney cancer Maternal Grandmother     Social History Social History   Tobacco Use  . Smoking status: Current Every Day Smoker    Packs/day: 0.50    Types: Cigarettes  . Smokeless tobacco: Never Used  Substance Use Topics  . Alcohol use: Yes  . Drug use: Not Currently    Review of Systems   Constitutional: No fever/chills Eyes: No visual changes. ENT: No sore throat.  Respiratory: Denies cough Genitourinary: Negative for dysuria. Musculoskeletal: Positive for back pain. Skin: Negative for rash.    ____________________________________________   PHYSICAL EXAM:  VITAL SIGNS: ED Triage Vitals  Enc Vitals Group     BP 06/05/18 1050 (!) 159/101     Pulse Rate 06/05/18 1050 74     Resp 06/05/18 1050 18     Temp 06/05/18 1050 97.8 F (36.6 C)     Temp Source 06/05/18 1050 Oral     SpO2 06/05/18 1050 99 %     Weight 06/05/18 1044 248 lb (112.5 kg)     Height 06/05/18 1044 _0  (1.702 m)     Head Circumference --      Peak Flow --      Pain Score 06/05/18 1044 8     Pain Loc --      Pain Edu? --      Excl. in [place]? --     Constitutional: Alert and oriented. Well appearing and in no acute distress. Eyes: Conjunctivae are normal.  Head: Atraumatic. Nose: No congestion/rhinnorhea. Mouth/Throat: Mucous membranes are moist.   Neck:  supple no lymphadenopathy noted Cardiovascular: Normal rate, regular rhythm.  Respiratory: Normal respiratory effort.  No retractions,GU: deferred Musculoskeletal: FROM all extremities, warm and well perfused.  Decreased rom of back due to discomfort, lumbar spine tender L5 in the SI joint, negative Slr, full strength in great toes b/l, full strength in lower legs, n/v intact Neurologic:  Normal speech  and language.  Skin:  Skin is warm, dry and intact. No rash noted. Psychiatric: Mood and affect are normal. Speech and behavior are normal.  ____________________________________________   LABS (all labs ordered are listed, but only abnormal results are displayed)  Labs Reviewed - No data to display ____________________________________________   ____________________________________________  RADIOLOGY    ____________________________________________   PROCEDURES  Procedure(s) performed: No  Procedures    ____________________________________________   INITIAL IMPRESSION / ASSESSMENT AND PLAN / ED  COURSE  Pertinent labs & imaging results that were available during my care of the patient were reviewed by me and considered in my medical decision making (see chart for details).   Patient is a 28 year old male presents emergency department complaining of low back pain that radiates to the left leg.  He states that his manager sent him home from work last night and he needs a note to return to work.  He states he has had lower back pain for a few days and a tingling sensation in the left leg.  No other complaints.  Physical exam shows patient to appear well.  Physical exam is basically unremarkable other than the SI joint and L5 being a little tender.  Pain is reproduced with palpation of the left buttock.  Remainder the exam is unremarkable.  Explained the findings to the patient.  He was given a prescription for Sterapred and Flexeril.  He is to return to work on Friday night or his regularly scheduled shift.  He is to apply ice to his lower back.  He was discharged in stable condition with a work note.     As part of my medical decision making, I reviewed the following data within the [place] notes reviewed and incorporated, Old chart reviewed, Notes from prior ED visits and [place] Controlled Substance Database  ____________________________________________   FINAL CLINICAL IMPRESSION(S) / ED DIAGNOSES  Final diagnoses:  Sciatica of left side      NEW MEDICATIONS STARTED DURING THIS VISIT:  New Prescriptions   CYCLOBENZAPRINE (FLEXERIL) 10 MG TABLET    Take 1 tablet (10 mg total) by mouth 3 (three) times daily as needed.   PREDNISONE (STERAPRED UNI-PAK 21 TAB) 10 MG (21) TBPK TABLET    Take 6 pills on day one then decrease by 1 pill each day     Note:  This document was prepared using Dragon voice recognition software and may include unintentional dictation errors.     Versie Starks, PA-C 06/05/18 1109    Earleen Newport, MD 06/05/18 1355

## 2018-06-05 NOTE — ED Triage Notes (Signed)
Presents with left leg pain  States pain started 2 days ago and moves from ankle into lower leg   Denies any injury  Ambulates with slight limp d/t pain   States no relief with OTC meds or ice/heat

## 2018-06-05 NOTE — Discharge Instructions (Addendum)
Follow-up with your regular doctor if not better in 5 to 7 days or Dr. Odis Luster at [facility].  Apply ice to the lower back.  Take medications as prescribed.  Return to the emergency department if worsening.

## 2018-06-15 ENCOUNTER — Other Ambulatory Visit: Payer: Self-pay

## 2018-06-15 ENCOUNTER — Encounter: Payer: Self-pay | Admitting: Emergency Medicine

## 2018-06-15 ENCOUNTER — Emergency Department
Admission: EM | Admit: 2018-06-15 | Discharge: 2018-06-15 | Disposition: A | Discharge status: Home / Self Care | Payer: BLUE CROSS/BLUE SHIELD | Attending: Emergency Medicine | Admitting: Emergency Medicine

## 2018-06-15 DIAGNOSIS — M5442 Lumbago with sciatica, left side: Secondary | ICD-10-CM | POA: Diagnosis not present

## 2018-06-15 DIAGNOSIS — I10 Essential (primary) hypertension: Secondary | ICD-10-CM | POA: Insufficient documentation

## 2018-06-15 DIAGNOSIS — M545 Low back pain: Secondary | ICD-10-CM | POA: Diagnosis present

## 2018-06-15 DIAGNOSIS — F17210 Nicotine dependence, cigarettes, uncomplicated: Secondary | ICD-10-CM | POA: Insufficient documentation

## 2018-06-15 HISTORY — DX: Essential (primary) hypertension: I10

## 2018-06-15 HISTORY — DX: Sarcoidosis, unspecified: D86.9

## 2018-06-15 MED ORDER — KETOROLAC TROMETHAMINE 30 MG/ML IJ SOLN
30.0000 mg | Freq: Once | INTRAMUSCULAR | Status: AC
  Administered 2018-06-15: 30 mg via INTRAMUSCULAR
  Filled 2018-06-15: qty 1

## 2018-06-15 MED ORDER — ORPHENADRINE CITRATE 30 MG/ML IJ SOLN
60.0000 mg | Freq: Two times a day (BID) | INTRAMUSCULAR | Status: DC
  Administered 2018-06-15: 60 mg via INTRAMUSCULAR
  Filled 2018-06-15: qty 2

## 2018-06-15 MED ORDER — PREDNISONE 10 MG (21) PO TBPK: ORAL_TABLET | ORAL | 0 refills | Status: DC

## 2018-06-15 MED ORDER — DEXAMETHASONE SODIUM PHOSPHATE 10 MG/ML IJ SOLN
20.0000 mg | Freq: Once | INTRAMUSCULAR | Status: AC
  Administered 2018-06-15: 20 mg via INTRAMUSCULAR
  Filled 2018-06-15: qty 2

## 2018-06-15 MED ORDER — METHOCARBAMOL 500 MG PO TABS: 500.0000 mg | ORAL_TABLET | Freq: Three times a day (TID) | ORAL | 0 refills | Status: AC | PRN

## 2018-06-15 NOTE — ED Triage Notes (Addendum)
Pt states back pain with nerve pain down left leg x 8 days. (seen on the 15th, started on the 13th). Was seen here and placed on steroids and pain meds. Here for pain controll. Has not followed up with pcp or ortho. No injury - chronic back pain issues (awoke with the pain on the 13th).

## 2018-06-15 NOTE — ED Provider Notes (Signed)
[facility] Emergency Department Provider Note  ____________________________________________  Time seen: Approximately 4:40 PM  I have reviewed the triage vital signs and the nursing notes.   HISTORY  Chief Complaint Back Pain    HPI Patrick [name] is a 28 y.o. male with a history of hypertension, presents to the emergency department with back pain with left lower extremity radiculopathy.  Patient has been seen and evaluated at this emergency department for similar complaints within the past 6 months.  Patient denies subjective weakness.  No bowel or bladder incontinence.  Patient reports that he had an intense spasm of low back pain today that concerned him.  He was recently treated with prednisone and a muscle relaxer.  He states that his pain completely resolved with the steroid but then slowly returned.  He did not have much pain relief with muscle relaxer.  He denies chest pain, chest tightness, nausea, vomiting or abdominal pain.  He has been able to ambulate and is observed moving easily from chair to exam table.        Past Medical History:  Diagnosis Date  . Hypertension   . Sarcoidosis     Patient Active Problem List   Diagnosis Date Noted  . Mediastinal lymphadenopathy 02/15/2018    Past Surgical History:  Procedure Laterality Date  . FLEXIBLE BRONCHOSCOPY N/A 03/07/2018   Procedure: FLEXIBLE BRONCHOSCOPY;  Surgeon: Wilhelmina Mcardle, MD;  Location: [facility] ORS;  Service: Pulmonary;  Laterality: N/A;    Prior to Admission medications   Medication Sig Start Date End Date Taking? Authorizing Provider  cyclobenzaprine (FLEXERIL) 10 MG tablet Take 1 tablet (10 mg total) by mouth 3 (three) times daily as needed. 06/05/18   Fisher, Linden Dolin, PA-C  methocarbamol (ROBAXIN) 500 MG tablet Take 1 tablet (500 mg total) by mouth every 8 (eight) hours as needed for up to 5 days. 06/15/18 06/20/18  Lannie Fields, PA-C  predniSONE (STERAPRED UNI-PAK 21 TAB) 10  MG (21) TBPK tablet Take 6 tabs the the 1st day. Take 6 tabs the the 2nd day. Take 5 tabs the the 3rd day. Take 5 tabs the 4th day. Take 4 tabs the the 5th day.Take 4 tabs the the 6th day.Take 3 tabs the 7th day.Take 3 tabs the 8th day. Take 2 tabs the 9th day. Take 2 tabs the 10th day. Take 1 tab the 11th day. Take 1 tab the 12th day. 06/15/18   Lannie Fields, PA-C    Allergies Patient has no known allergies.  Family History  Problem Relation Age of Onset  . Kidney cancer Maternal Grandmother     Social History Social History   Tobacco Use  . Smoking status: Current Every Day Smoker    Packs/day: 0.50    Types: Cigarettes  . Smokeless tobacco: Never Used  Substance Use Topics  . Alcohol use: Yes  . Drug use: Not Currently     Review of Systems  Constitutional: No fever/chills Eyes: No visual changes. No discharge ENT: No upper respiratory complaints. Cardiovascular: no chest pain. Respiratory: no cough. No SOB. Gastrointestinal: No abdominal pain.  No nausea, no vomiting.  No diarrhea.  No constipation. Genitourinary: Negative for dysuria. No hematuri Musculoskeletal: Patient has low back pain.  Skin: Negative for rash, abrasions, lacerations, ecchymosis. Neurological: Negative for headaches, focal weakness or numbness.   ____________________________________________   PHYSICAL EXAM:  VITAL SIGNS: ED Triage Vitals  Enc Vitals Group     BP 06/15/18 1452 (!) 148/98  Pulse Rate 06/15/18 1452 (!) 102     Resp 06/15/18 1452 16     Temp 06/15/18 1452 98 F (36.7 C)     Temp src --      SpO2 06/15/18 1452 96 %     Weight 06/15/18 1453 248 lb (112.5 kg)     Height 06/15/18 1453 _0  (1.702 m)     Head Circumference --      Peak Flow --      Pain Score 06/15/18 1453 10     Pain Loc --      Pain Edu? --      Excl. in [place]? --      Constitutional: Alert and oriented. Well appearing and in no acute distress. Eyes: Conjunctivae are normal. PERRL. EOMI. Head:  Atraumatic. Cardiovascular: Normal rate, regular rhythm. Normal S1 and S2.  Good peripheral circulation. Respiratory: Normal respiratory effort without tachypnea or retractions. Lungs CTAB. Good air entry to the bases with no decreased or absent breath sounds. Gastrointestinal: Bowel sounds 4 quadrants. Soft and nontender to palpation. No guarding or rigidity. No palpable masses. No distention. No CVA tenderness. Musculoskeletal: Patient has 5 out of 5 strength in the lower extremities bilaterally and symmetrically.  Patient has no diminished sensation to light or deep touch.  Positive straight leg raise test, left.  Palpable dorsalis pedis pulse bilaterally and symmetrically. Neurologic:  Normal speech and language. No gross focal neurologic deficits are appreciated.  Skin:  Skin is warm, dry and intact. No rash noted. Psychiatric: Mood and affect are normal. Speech and behavior are normal. Patient exhibits appropriate insight and judgement.   ____________________________________________   LABS (all labs ordered are listed, but only abnormal results are displayed)  Labs Reviewed - No data to display ____________________________________________  EKG   ____________________________________________  RADIOLOGY   No results found.  ____________________________________________    PROCEDURES  Procedure(s) performed:    Procedures    Medications  orphenadrine (NORFLEX) injection 60 mg (60 mg Intramuscular Given 06/15/18 1523)  ketorolac (TORADOL) 30 MG/ML injection 30 mg (30 mg Intramuscular Given 06/15/18 1523)  dexamethasone (DECADRON) injection 20 mg (20 mg Intramuscular Given 06/15/18 1520)     ____________________________________________   INITIAL IMPRESSION / ASSESSMENT AND PLAN / ED COURSE  Pertinent labs & imaging results that were available during my care of the patient were reviewed by me and considered in my medical decision making (see chart for  details).  Review of the [place] CSRS was performed in accordance of the [place] prior to dispensing any controlled drugs.          Assessment and plan Low back pain 28 year old male presents to the emergency department with low back pain with left lower extremity radiculopathy.  On physical exam, patient seemed uncomfortable but was able to undergo a complete neuro exam.  Neurologic exam was reassuring.  I had an extensive conversation with patient about conducting a lumbar spine MRI in the emergency department as patient has been seen twice in the past 6 months for similar complaints.  I explained to patient that he could potentially have a bill if I ordered a MRI of the lumbar spine and he stated that he would prefer to follow-up with neurosurgery to schedule a nonemergent MRI.  Given reassuring neuro findings, I feel that this is a reasonable request.  Patient was given Toradol, Robaxin and Decadron in the emergency department he reported that his pain almost completely resolved.  He was discharged with a longer taper  of prednisone and Robaxin and given a referral to neurosurgery, Dr. Cari Caraway.   ____________________________________________  FINAL CLINICAL IMPRESSION(S) / ED DIAGNOSES  Final diagnoses:  Acute left-sided low back pain with left-sided sciatica      NEW MEDICATIONS STARTED DURING THIS VISIT:  ED Discharge Orders         Ordered    predniSONE (STERAPRED UNI-PAK 21 TAB) 10 MG (21) TBPK tablet     06/15/18 1557    methocarbamol (ROBAXIN) 500 MG tablet  Every 8 hours PRN     06/15/18 1557              This chart was dictated using voice recognition software/Dragon. Despite best efforts to proofread, errors can occur which can change the meaning. Any change was purely unintentional.    Lannie Fields, PA-C 06/15/18 1651    Earleen Newport, MD 06/15/18 [phone]

## 2018-07-04 ENCOUNTER — Telehealth: Payer: Self-pay | Admitting: Pulmonary Disease

## 2018-07-04 NOTE — Telephone Encounter (Signed)
Work accommodation paperwork received by pt and placed in inter office mail to be sent to [place], along with ROI form.

## 2018-07-08 ENCOUNTER — Encounter: Payer: Self-pay | Admitting: Anesthesiology

## 2018-07-09 ENCOUNTER — Telehealth: Payer: Self-pay | Admitting: Pulmonary Disease

## 2018-07-10 NOTE — Telephone Encounter (Signed)
LM on VM that we have the letter as requested. We just need to know how he would like to get it. It is on mychart, but would he like to pick it up or fax?

## 2018-07-10 NOTE — Telephone Encounter (Signed)
Pt just called and stated that he would rather pick the note up for work and he will pick it up on this Friday 5/22

## 2018-07-10 NOTE — Telephone Encounter (Signed)
Letter has been placed up front for pickup. Pt stated that he would come by our office on 07/12/2018 to pick up.  Nothing further is needed.

## 2018-07-10 NOTE — Telephone Encounter (Signed)
No restrictions  Patrick [name]

## 2018-08-22 ENCOUNTER — Encounter: Payer: Self-pay | Admitting: Emergency Medicine

## 2018-08-22 ENCOUNTER — Emergency Department
Admission: EM | Admit: 2018-08-22 | Discharge: 2018-08-22 | Disposition: A | Discharge status: Home / Self Care | Payer: BLUE CROSS/BLUE SHIELD | Attending: Emergency Medicine | Admitting: Emergency Medicine

## 2018-08-22 ENCOUNTER — Other Ambulatory Visit: Payer: Self-pay

## 2018-08-22 DIAGNOSIS — I10 Essential (primary) hypertension: Secondary | ICD-10-CM | POA: Insufficient documentation

## 2018-08-22 DIAGNOSIS — R109 Unspecified abdominal pain: Secondary | ICD-10-CM | POA: Diagnosis present

## 2018-08-22 DIAGNOSIS — F17210 Nicotine dependence, cigarettes, uncomplicated: Secondary | ICD-10-CM | POA: Diagnosis not present

## 2018-08-22 DIAGNOSIS — R197 Diarrhea, unspecified: Secondary | ICD-10-CM | POA: Diagnosis not present

## 2018-08-22 DIAGNOSIS — T6291XA Toxic effect of unspecified noxious substance eaten as food, accidental (unintentional), initial encounter: Secondary | ICD-10-CM | POA: Insufficient documentation

## 2018-08-22 DIAGNOSIS — R112 Nausea with vomiting, unspecified: Secondary | ICD-10-CM | POA: Diagnosis not present

## 2018-08-22 DIAGNOSIS — A059 Bacterial foodborne intoxication, unspecified: Secondary | ICD-10-CM

## 2018-08-22 LAB — CBC
HCT: 43.9 % (ref 39.0–52.0)
Hemoglobin: 14.3 g/dL (ref 13.0–17.0)
MCH: 28 pg (ref 26.0–34.0)
MCHC: 32.6 g/dL (ref 30.0–36.0)
MCV: 86.1 fL (ref 80.0–100.0)
Platelets: 232 10*3/uL (ref 150–400)
RBC: 5.1 MIL/uL (ref 4.22–5.81)
RDW: 13 % (ref 11.5–15.5)
WBC: 5.9 10*3/uL (ref 4.0–10.5)
nRBC: 0 % (ref 0.0–0.2)

## 2018-08-22 LAB — COMPREHENSIVE METABOLIC PANEL
ALT: 26 U/L (ref 0–44)
AST: 22 U/L (ref 15–41)
Albumin: 4 g/dL (ref 3.5–5.0)
Alkaline Phosphatase: 51 U/L (ref 38–126)
Anion gap: 11 (ref 5–15)
BUN: 16 mg/dL (ref 6–20)
CO2: 24 mmol/L (ref 22–32)
Calcium: 9 mg/dL (ref 8.9–10.3)
Chloride: 105 mmol/L (ref 98–111)
Creatinine, Ser: 1.05 mg/dL (ref 0.61–1.24)
GFR calc Af Amer: >60 mL/min (ref >60)
GFR calc non Af Amer: >60 mL/min (ref >60)
Glucose, Bld: 99 mg/dL (ref 70–99)
Potassium: 4 mmol/L (ref 3.5–5.1)
Sodium: 140 mmol/L (ref 135–145)
Total Bilirubin: 0.5 mg/dL (ref 0.3–1.2)
Total Protein: 7.6 g/dL (ref 6.5–8.1)

## 2018-08-22 LAB — URINALYSIS, COMPLETE (UACMP) WITH MICROSCOPIC
Bacteria, UA: NONE SEEN
Bilirubin Urine: NEGATIVE
Glucose, UA: NEGATIVE mg/dL
Ketones, ur: NEGATIVE mg/dL
Leukocytes,Ua: NEGATIVE
Nitrite: NEGATIVE
Protein, ur: NEGATIVE mg/dL
Specific Gravity, Urine: 1.019 (ref 1.005–1.030)
pH: 5 (ref 5.0–8.0)

## 2018-08-22 LAB — LIPASE, BLOOD: Lipase: 31 U/L (ref 11–51)

## 2018-08-22 MED ORDER — ONDANSETRON 4 MG PO TBDP: 4.0000 mg | ORAL_TABLET | Freq: Four times a day (QID) | ORAL | 0 refills | Status: DC | PRN

## 2018-08-22 NOTE — ED Provider Notes (Signed)
[facility] Emergency Department Provider Note   ____________________________________________   First MD Initiated Contact with Patient 08/22/18 1524     (approximate)  I have reviewed the triage vital signs and the nursing notes.   HISTORY  Chief Complaint Abdominal Pain, Emesis, and Diarrhea    HPI Patrick [name] is a 28 y.o. male has a history of sarcoidosis hypertension  Patient reports that 2 days ago he suddenly had stomach upset nausea after eating old pizza.  Reports he ate an old pizza slice and he noticed that did not taste right and then about an hour later became very nauseated.   He also had several loose stools and vomited several times a day.  Because of his sarcoidosis he has not been at work.  He returned to work and reported to them his symptoms and they recommended that he need to be cleared to return to work  Reports his symptoms are actually better.  He is not having any more vomiting.  His diarrhea is improved.  Not having stomach pain no fevers.  Feels much better.  No headaches no chest pain no shortness of breath.  Takes medication for blood pressure daily  Past Medical History:  Diagnosis Date   Hypertension    Sarcoidosis     Patient Active Problem List   Diagnosis Date Noted   Mediastinal lymphadenopathy 02/15/2018    Past Surgical History:  Procedure Laterality Date   FLEXIBLE BRONCHOSCOPY N/A 03/07/2018   Procedure: FLEXIBLE BRONCHOSCOPY;  Surgeon: Wilhelmina Mcardle, MD;  Location: [facility] ORS;  Service: Pulmonary;  Laterality: N/A;    Prior to Admission medications   Medication Sig Start Date End Date Taking? Authorizing Provider  cyclobenzaprine (FLEXERIL) 10 MG tablet Take 1 tablet (10 mg total) by mouth 3 (three) times daily as needed. 06/05/18   Fisher, Linden Dolin, PA-C  ondansetron (ZOFRAN ODT) 4 MG disintegrating tablet Take 1 tablet (4 mg total) by mouth every 6 (six) hours as needed for nausea or vomiting.  08/22/18   Delman Kitten, MD  predniSONE (STERAPRED UNI-PAK 21 TAB) 10 MG (21) TBPK tablet Take 6 tabs the the 1st day. Take 6 tabs the the 2nd day. Take 5 tabs the the 3rd day. Take 5 tabs the 4th day. Take 4 tabs the the 5th day.Take 4 tabs the the 6th day.Take 3 tabs the 7th day.Take 3 tabs the 8th day. Take 2 tabs the 9th day. Take 2 tabs the 10th day. Take 1 tab the 11th day. Take 1 tab the 12th day. 06/15/18   Lannie Fields, PA-C    Allergies Patient has no known allergies.  Family History  Problem Relation Age of Onset   Kidney cancer Maternal Grandmother     Social History Social History   Tobacco Use   Smoking status: Current Every Day Smoker    Packs/day: 0.50    Types: Cigarettes   Smokeless tobacco: Never Used  Substance Use Topics   Alcohol use: Yes   Drug use: Not Currently    Review of Systems Constitutional: No fever/chills Eyes: No visual changes. ENT: No sore throat. Cardiovascular: Denies chest pain. Respiratory: Denies shortness of breath.  No cough. Gastrointestinal: No abdominal pain.  Had some crampy discomfort along with nausea and vomiting and loose stools but these are largely resolved Genitourinary: Negative for dysuria. Musculoskeletal: Negative for back pain. Skin: Negative for rash. Neurological: Negative for headaches, areas of focal weakness or numbness.  He has not been  around anyone with close contact who has coronavirus.  One person at his work had it but he was out of work and not around that person  ____________________________________________   PHYSICAL EXAM:  VITAL SIGNS: ED Triage Vitals  Enc Vitals Group     BP 08/22/18 1316 (!) 161/118     Pulse Rate 08/22/18 1316 (!) 103     Resp 08/22/18 1316 16     Temp 08/22/18 1316 98.8 F (37.1 C)     Temp Source 08/22/18 1316 Oral     SpO2 08/22/18 1316 98 %     Weight 08/22/18 1317 250 lb (113.4 kg)     Height 08/22/18 1317 _0  (1.702 m)     Head Circumference --      Peak  Flow --      Pain Score 08/22/18 1317 0     Pain Loc --      Pain Edu? --      Excl. in [place]? --     Constitutional: Alert and oriented. Well appearing and in no acute distress. Eyes: Conjunctivae are normal. Head: Atraumatic. Nose: No congestion/rhinnorhea. Mouth/Throat: Mucous membranes are moist. Neck: No stridor.  Cardiovascular: Normal rate, regular rhythm. Grossly normal heart sounds.  Good peripheral circulation. Respiratory: Normal respiratory effort.  No retractions. Lungs CTAB. Gastrointestinal: Soft and nontender. No distention.  Denies any pain in any quadrant.  No pain to McBurney's point.  Negative Murphy.  No rebound or guarding. Musculoskeletal: No lower extremity tenderness nor edema. Neurologic:  Normal speech and language. No gross focal neurologic deficits are appreciated.  Skin:  Skin is warm, dry and intact. No rash noted. Psychiatric: Mood and affect are normal. Speech and behavior are normal.  ____________________________________________   LABS (all labs ordered are listed, but only abnormal results are displayed)  Labs Reviewed  URINALYSIS, COMPLETE (UACMP) WITH MICROSCOPIC - Abnormal; Notable for the following components:      Result Value   Color, Urine YELLOW (*)    APPearance CLEAR (*)    Hgb urine dipstick LARGE (*)    All other components within normal limits  LIPASE, BLOOD  COMPREHENSIVE METABOLIC PANEL  CBC   ____________________________________________  EKG   ____________________________________________  RADIOLOGY  No indication for imaging denoted. ____________________________________________   PROCEDURES  Procedure(s) performed: None  Procedures  Critical Care performed: No  ____________________________________________   INITIAL IMPRESSION / ASSESSMENT AND PLAN / ED COURSE  Pertinent labs & imaging results that were available during my care of the patient were reviewed by me and considered in my medical decision making  (see chart for details).   Patient presents for evaluation of nausea vomiting loose stools of essentially resolved.  He reports that his work instructed him to come.  He did have a little bit of a metallic or acid taste associated but overall feels much better.  Very reassuring examination.  Afebrile normal white count.  No symptoms that would suggest COVID.  Jaivian Battaglini Decandia was evaluated in Emergency Department on 08/22/2018 for the symptoms described in the history of present illness. He was evaluated in the context of the global COVID-19 pandemic, which necessitated consideration that the patient might be at risk for infection with the SARS-CoV-2 virus that causes COVID-19. Institutional protocols and algorithms that pertain to the evaluation of patients at risk for COVID-19 are in a state of rapid change based on information released by regulatory bodies including the CDC and federal and state organizations. These policies and algorithms were  followed during the patient's care in the ED.  Discussed with the patient careful return precautions including careful abdominal pain return precautions.  He is comfortable with this.  He does advise me that his work told him he have to be at work for 3 days, so I have thus written him a work note to return on Monday.  I provided him a prescription for Zofran should he have any ongoing or lingering nausea, patient states he will definitely take the prescription and fill it if he does not needing it      ____________________________________________   FINAL CLINICAL IMPRESSION(S) / ED DIAGNOSES  Final diagnoses:  Non-intractable vomiting with nausea, unspecified vomiting type  Food poisoning        Note:  This document was prepared using Dragon voice recognition software and may include unintentional dictation errors       Delman Kitten, MD 08/22/18 1559

## 2018-08-22 NOTE — Discharge Instructions (Signed)
? ?  Please return to the emergency room right away if you are to develop a fever, severe nausea, your pain becomes severe or worsens, you are unable to keep food down, begin vomiting any dark or bloody fluid, you develop any dark or bloody stools, feel dehydrated, or other new concerns or symptoms arise. ? ?

## 2018-08-22 NOTE — ED Triage Notes (Signed)
Patient reports he ate old pizza on Tuesday and since then has had abdominal pain with nausea, vomiting and diarrhea. Patient denies any fever at home. Denies contact with anyone who has been sick.

## 2019-01-19 IMAGING — DX DG CHEST 1V PORT
1 series · 1 of 1 positions shown · non-contrast
Comparison: CT chest and PA and lateral chest 01/21/2018.

CLINICAL DATA: Status post bronchoscopy today.

EXAM:
PORTABLE CHEST 1 VIEW

[chest ap]
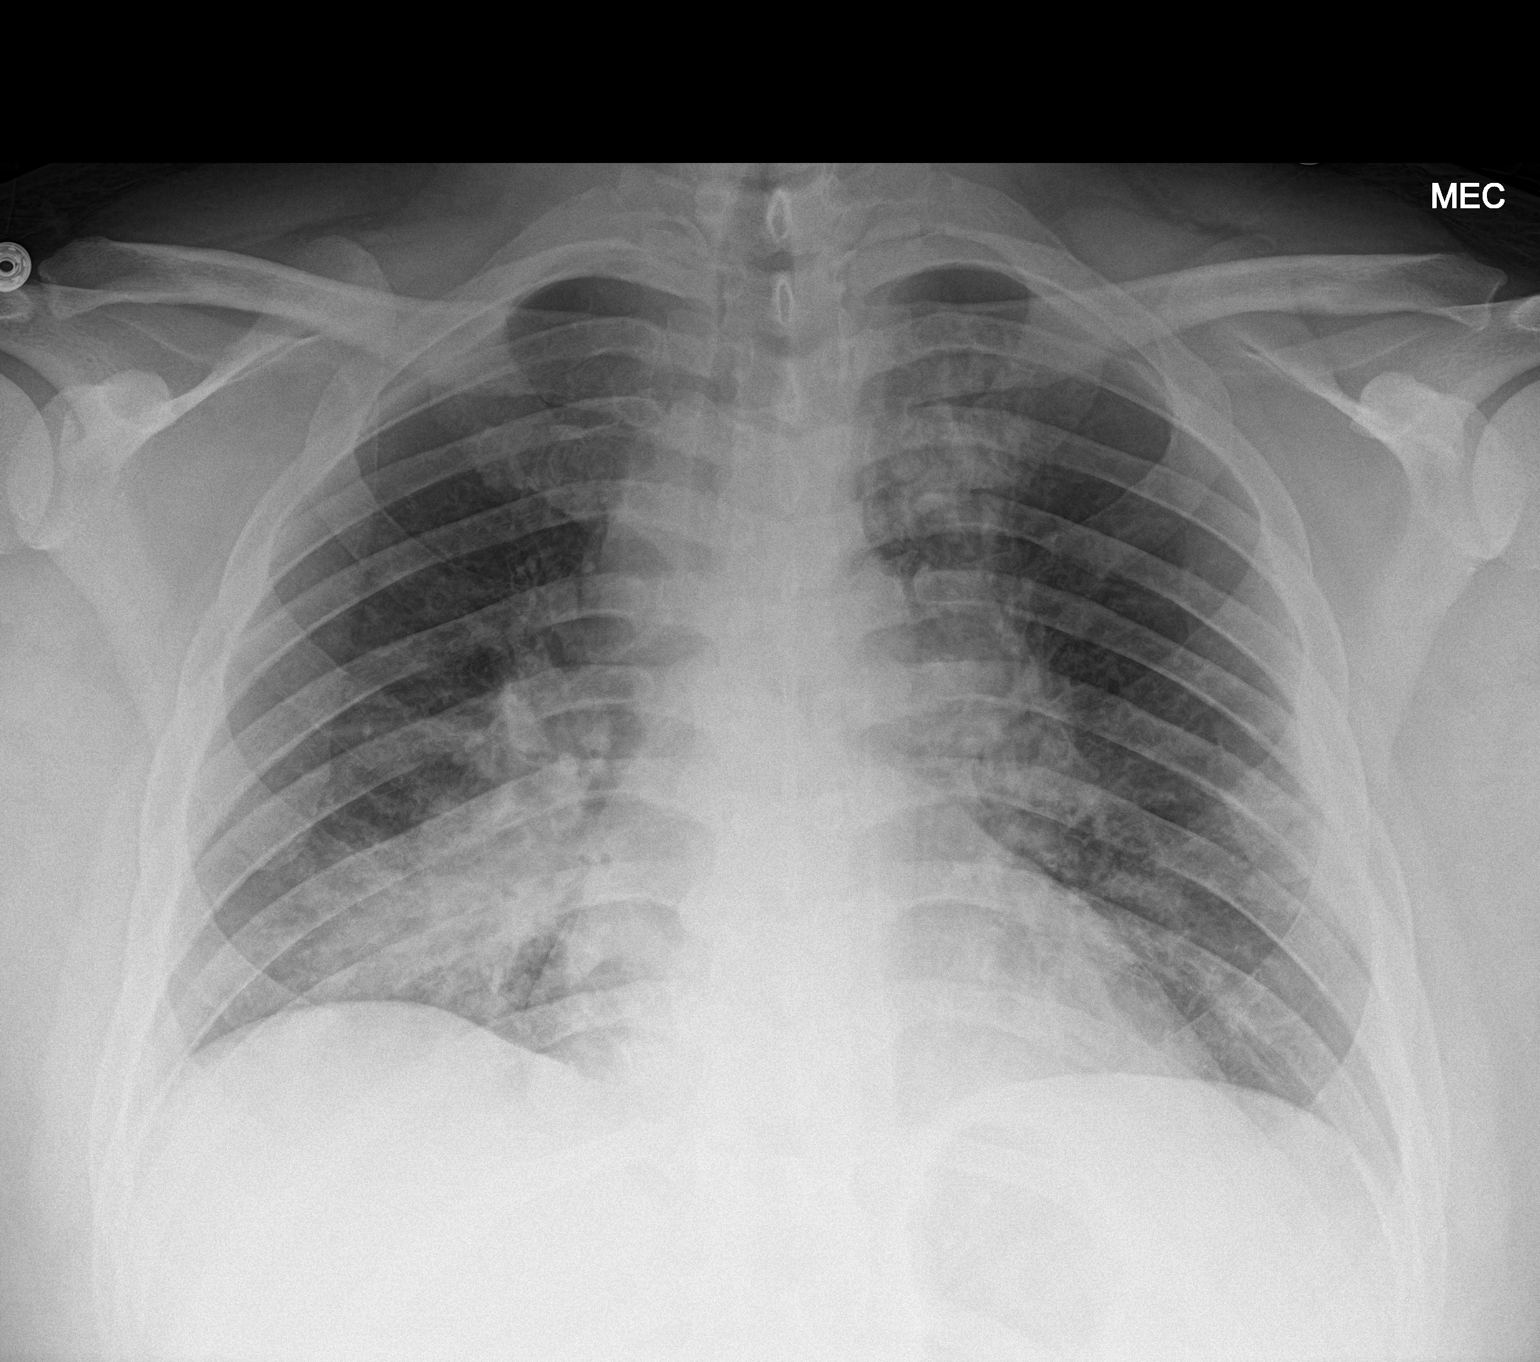

[1 of 1 positions shown; findings below may reference images not displayed]

FINDINGS: There is no pneumothorax after bronchoscopy. Bulky mediastinal and
hilar lymphadenopathy is identified. There is new airspace opacity
in the right lung base. Heart size is normal. No acute or focal bony
abnormality.
IMPRESSION: Negative for pneumothorax after bronchoscopy.

New hazy airspace opacity in left lung base could be due to some
hemorrhage related to biopsy, atelectasis or much less likely
pneumonia.

Bulky mediastinal and hilar lymphadenopathy.

## 2019-02-23 IMAGING — CR DG LUMBAR SPINE 2-3V
1 series · 3 of 3 positions shown · non-contrast
Comparison: CT 06/08/2017

CLINICAL DATA: Sudden onset low back pain yesterday lifting a
Pallet at work.

EXAM:
LUMBAR SPINE - 2-3 VIEW

[Series 1: dg lumbar spine 2-3 views · 0.14mm/px · 3 of 3 slices shown]
[im 1/3]
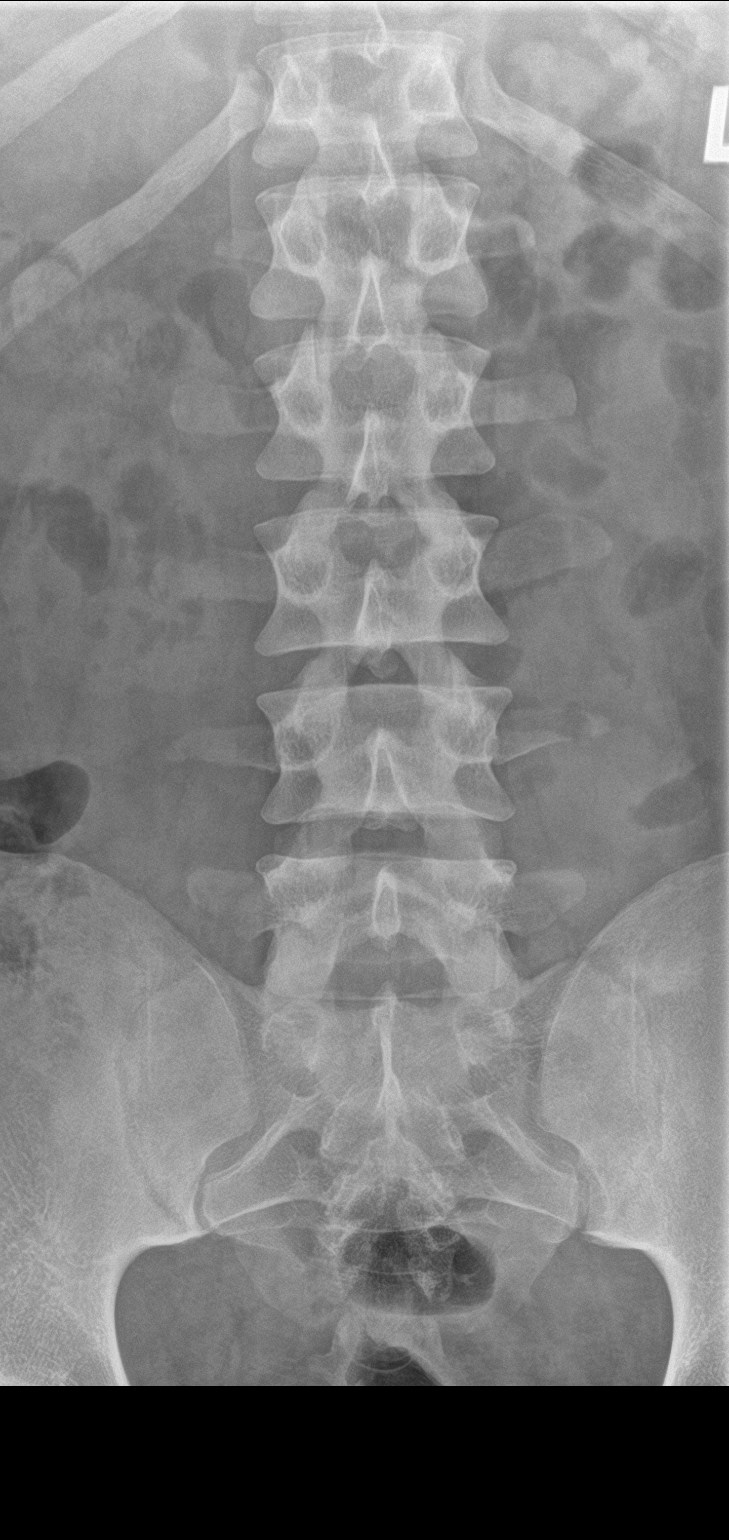
[im 2/3]
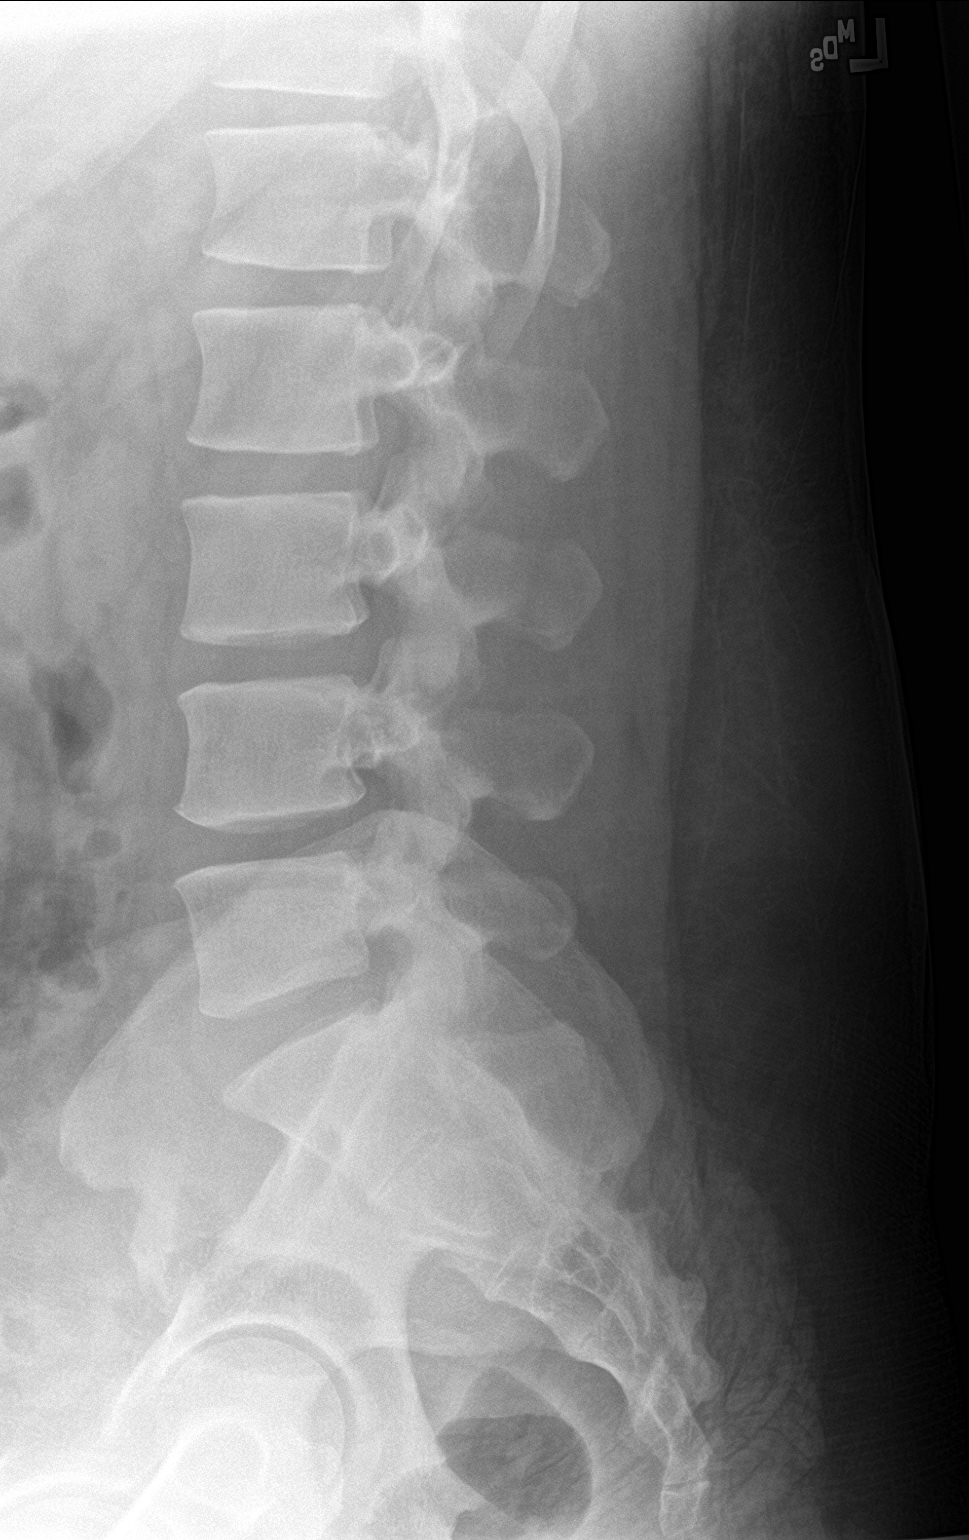
[im 3/3]
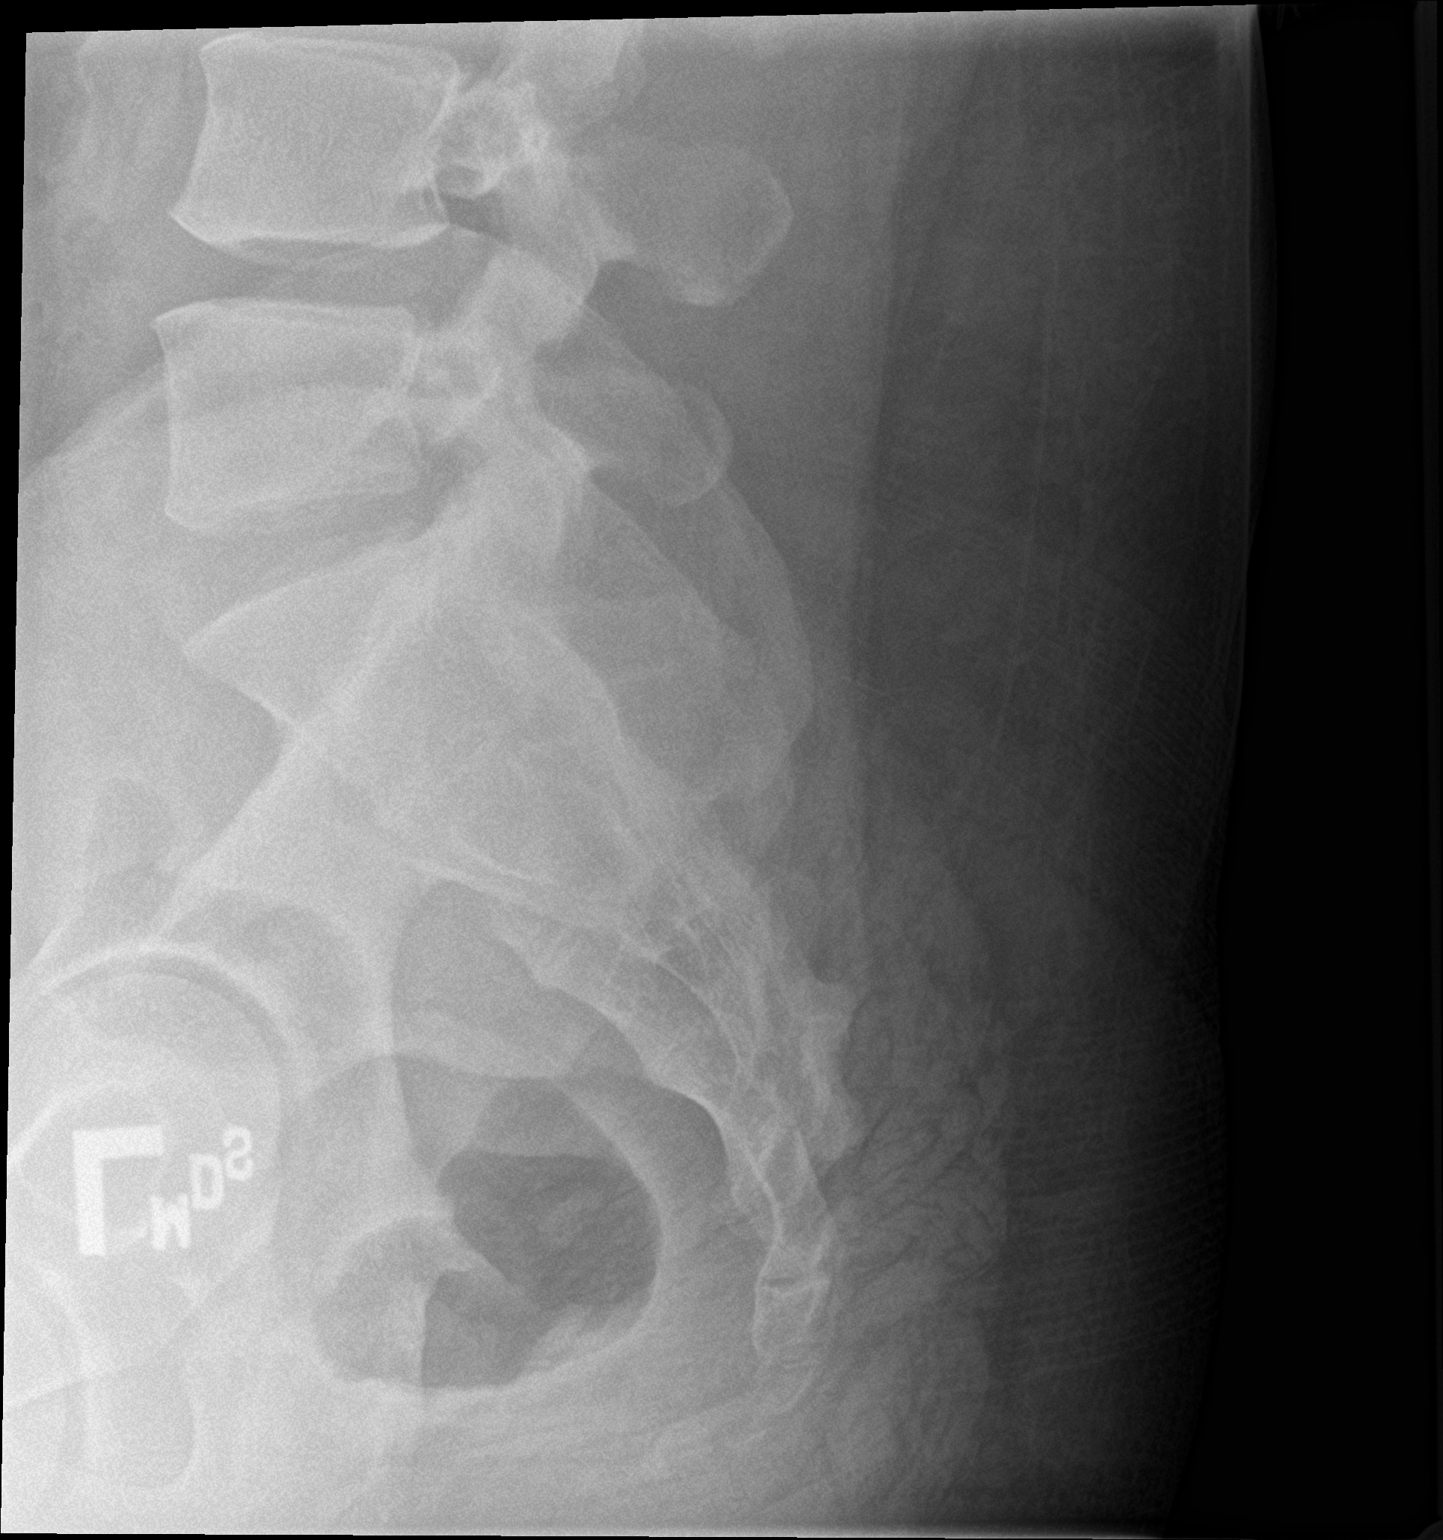

[3 of 3 positions shown; findings below may reference images not displayed]

FINDINGS: Vertebral body alignment and heights are normal. Subtle disc space
narrowing at the L4-5 level. No evidence of compression fracture or
spondylolisthesis.
IMPRESSION: No acute findings.

Subtle disc space narrowing at the L4-5 level.

## 2019-04-10 ENCOUNTER — Encounter: Payer: Self-pay | Admitting: Emergency Medicine

## 2019-04-10 ENCOUNTER — Emergency Department: Payer: Self-pay

## 2019-04-10 ENCOUNTER — Emergency Department
Admission: EM | Admit: 2019-04-10 | Discharge: 2019-04-10 | Disposition: A | Discharge status: Home / Self Care | Payer: Self-pay | Attending: Emergency Medicine | Admitting: Emergency Medicine

## 2019-04-10 ENCOUNTER — Other Ambulatory Visit: Payer: Self-pay

## 2019-04-10 DIAGNOSIS — S8001XA Contusion of right knee, initial encounter: Secondary | ICD-10-CM | POA: Insufficient documentation

## 2019-04-10 DIAGNOSIS — Y998 Other external cause status: Secondary | ICD-10-CM | POA: Insufficient documentation

## 2019-04-10 DIAGNOSIS — Y9301 Activity, walking, marching and hiking: Secondary | ICD-10-CM | POA: Insufficient documentation

## 2019-04-10 DIAGNOSIS — Y9289 Other specified places as the place of occurrence of the external cause: Secondary | ICD-10-CM | POA: Insufficient documentation

## 2019-04-10 DIAGNOSIS — W108XXA Fall (on) (from) other stairs and steps, initial encounter: Secondary | ICD-10-CM | POA: Insufficient documentation

## 2019-04-10 DIAGNOSIS — F17210 Nicotine dependence, cigarettes, uncomplicated: Secondary | ICD-10-CM | POA: Insufficient documentation

## 2019-04-10 DIAGNOSIS — I10 Essential (primary) hypertension: Secondary | ICD-10-CM | POA: Insufficient documentation

## 2019-04-10 DIAGNOSIS — R030 Elevated blood-pressure reading, without diagnosis of hypertension: Secondary | ICD-10-CM | POA: Insufficient documentation

## 2019-04-10 MED ORDER — IBUPROFEN 600 MG PO TABS: 600.0000 mg | ORAL_TABLET | Freq: Three times a day (TID) | ORAL | 0 refills | Status: DC | PRN

## 2019-04-10 NOTE — ED Notes (Signed)
See triage note  Presents s/p fall  States he fell coming down steps   Landed on right knee   Ambulates with slight limp

## 2019-04-10 NOTE — ED Triage Notes (Signed)
Patient to ER for c/o right knee pain after slipping and falling on knee due to ice outside.

## 2019-04-10 NOTE — ED Provider Notes (Signed)
[facility] Emergency Department Provider Note  ____________________________________________   First MD Initiated Contact with Patient 04/10/19 1135     (approximate)  I have reviewed the triage vital signs and the nursing notes.   HISTORY  Chief Complaint Knee Injury    HPI Patrick [name] is a 29 y.o. male presents to the ED after he fell coming down his steps at home landing directly on his right knee due to ice on his steps.  Patient denies any head injury or loss of consciousness.  Patient has continued to ambulate but with a limp and was unable to go to work today.  He rates his pain as 6 out of 10.       Past Medical History:  Diagnosis Date  . Hypertension   . Sarcoidosis     Patient Active Problem List   Diagnosis Date Noted  . Mediastinal lymphadenopathy 02/15/2018    Past Surgical History:  Procedure Laterality Date  . FLEXIBLE BRONCHOSCOPY N/A 03/07/2018   Procedure: FLEXIBLE BRONCHOSCOPY;  Surgeon: Wilhelmina Mcardle, MD;  Location: [facility] ORS;  Service: Pulmonary;  Laterality: N/A;    Prior to Admission medications   Medication Sig Start Date End Date Taking? Authorizing Provider  ibuprofen (ADVIL) 600 MG tablet Take 1 tablet (600 mg total) by mouth every 8 (eight) hours as needed. 04/10/19   Johnn Hai, PA-C    Allergies Patient has no known allergies.  Family History  Problem Relation Age of Onset  . Kidney cancer Maternal Grandmother     Social History Social History   Tobacco Use  . Smoking status: Current Every Day Smoker    Packs/day: 0.50    Types: Cigarettes  . Smokeless tobacco: Never Used  Substance Use Topics  . Alcohol use: Yes  . Drug use: Not Currently    Review of Systems Constitutional: No fever/chills Eyes: No visual changes. ENT: No trauma. Cardiovascular: Denies chest pain. Respiratory: Denies shortness of breath. Musculoskeletal: Positive for right knee pain. Skin: Negative for  rash. Neurological: Negative for headaches, focal weakness or numbness. ___________________________________________   PHYSICAL EXAM:  VITAL SIGNS: ED Triage Vitals  Enc Vitals Group     BP 04/10/19 1109 (!) 164/103     Pulse Rate 04/10/19 1109 96     Resp 04/10/19 1109 18     Temp 04/10/19 1109 98.1 F (36.7 C)     Temp Source 04/10/19 1109 Oral     SpO2 04/10/19 1109 95 %     Weight 04/10/19 1109 265 lb (120.2 kg)     Height 04/10/19 1109 _0  (1.702 m)     Head Circumference --      Peak Flow --      Pain Score 04/10/19 1112 6     Pain Loc --      Pain Edu? --      Excl. in [place]? --     Constitutional: Alert and oriented. Well appearing and in no acute distress. Eyes: Conjunctivae are normal.  Head: Atraumatic. Neck: No stridor.   Cardiovascular: Normal rate, regular rhythm. Grossly normal heart sounds.  Good peripheral circulation. Respiratory: Normal respiratory effort.  No retractions. Lungs CTAB. Gastrointestinal: Soft and nontender. No distention.  Musculoskeletal: On examination of the right knee there is no gross deformity or effusion present.  Patient is able to flex and extend his knee without any difficulty no crepitus was noted.  Ligaments are stable bilaterally.  Patient is able to bear weight  without difficulty.  Skin is intact and no discoloration is noted. Neurologic:  Normal speech and language. No gross focal neurologic deficits are appreciated.  Skin:  Skin is warm, dry and intact.  No ecchymosis or abrasions are seen. Psychiatric: Mood and affect are normal. Speech and behavior are normal.  ____________________________________________   LABS (all labs ordered are listed, but only abnormal results are displayed)  Labs Reviewed - No data to display  RADIOLOGY   Official radiology report(s): DG Knee Complete 4 Views Right  Result Date: 04/10/2019 CLINICAL DATA:  Right knee pain due to a slip and fall on ice today. Initial encounter. EXAM: RIGHT  KNEE - COMPLETE 4+ VIEW COMPARISON:  Plain films of the knee 12/11/2013. FINDINGS: No evidence of fracture, dislocation, or joint effusion. No evidence of arthropathy or other focal bone abnormality. Soft tissues are unremarkable. IMPRESSION: Negative exam. Electronically Signed   By: Inge Rise M.D.   On: 04/10/2019 11:44    ____________________________________________   PROCEDURES  Procedure(s) performed (including Critical Care):  Procedures   ____________________________________________   INITIAL IMPRESSION / ASSESSMENT AND PLAN / ED COURSE  As part of my medical decision making, I reviewed the following data within the electronic MEDICAL RECORD NUMBER Notes from prior ED visits and  Controlled Substance Database  29 year old male presents to the ED with complaint of right knee pain after falling on ice today.  He states that he landed directly on his knee.  He denies any LOC or head injury.  Patient has continued to walk but has a limp.  Physical exam was unremarkable.  X-rays were negative for fracture.  Patient was made aware.  He was given a note to remain out of work the next 2 days and instructions to ice and elevate.  A prescription for ibuprofen was sent to his pharmacy.  He is to follow-up with his PCP if any continued problems and to recheck his blood pressure which was elevated in the ED.   ____________________________________________   FINAL CLINICAL IMPRESSION(S) / ED DIAGNOSES  Final diagnoses:  Contusion of right knee, initial encounter  Elevated blood pressure reading     ED Discharge Orders         Ordered    ibuprofen (ADVIL) 600 MG tablet  Every 8 hours PRN,   Status:  Discontinued     04/10/19 1203    ibuprofen (ADVIL) 600 MG tablet  Every 8 hours PRN     04/10/19 1208           Note:  This document was prepared using Dragon voice recognition software and may include unintentional dictation errors.    Johnn Hai, PA-C 04/10/19  1213    Duffy Bruce, MD 04/11/19 1102

## 2019-04-10 NOTE — Discharge Instructions (Addendum)
Follow-up with your primary care provider if any continued problems or concerns.  Ice and elevation to decrease pain and any swelling.  A prescription for ibuprofen was sent to your pharmacy.  Also your blood pressure was elevated while in the emergency department.  You need to follow-up with your primary care provider for recheck of your blood pressure.

## 2020-02-22 IMAGING — CR DG KNEE COMPLETE 4+V*R*
1 series · 4 of 4 positions shown · non-contrast
Comparison: Plain films of the knee 12/11/2013.

CLINICAL DATA: Right knee pain due to a slip and fall on ice today.
Initial encounter.

EXAM:
RIGHT KNEE - COMPLETE 4+ VIEW

[Series 1: dg knee complete 4 views right · 0.14mm/px · 4 of 4 slices shown]
[im 1/4]
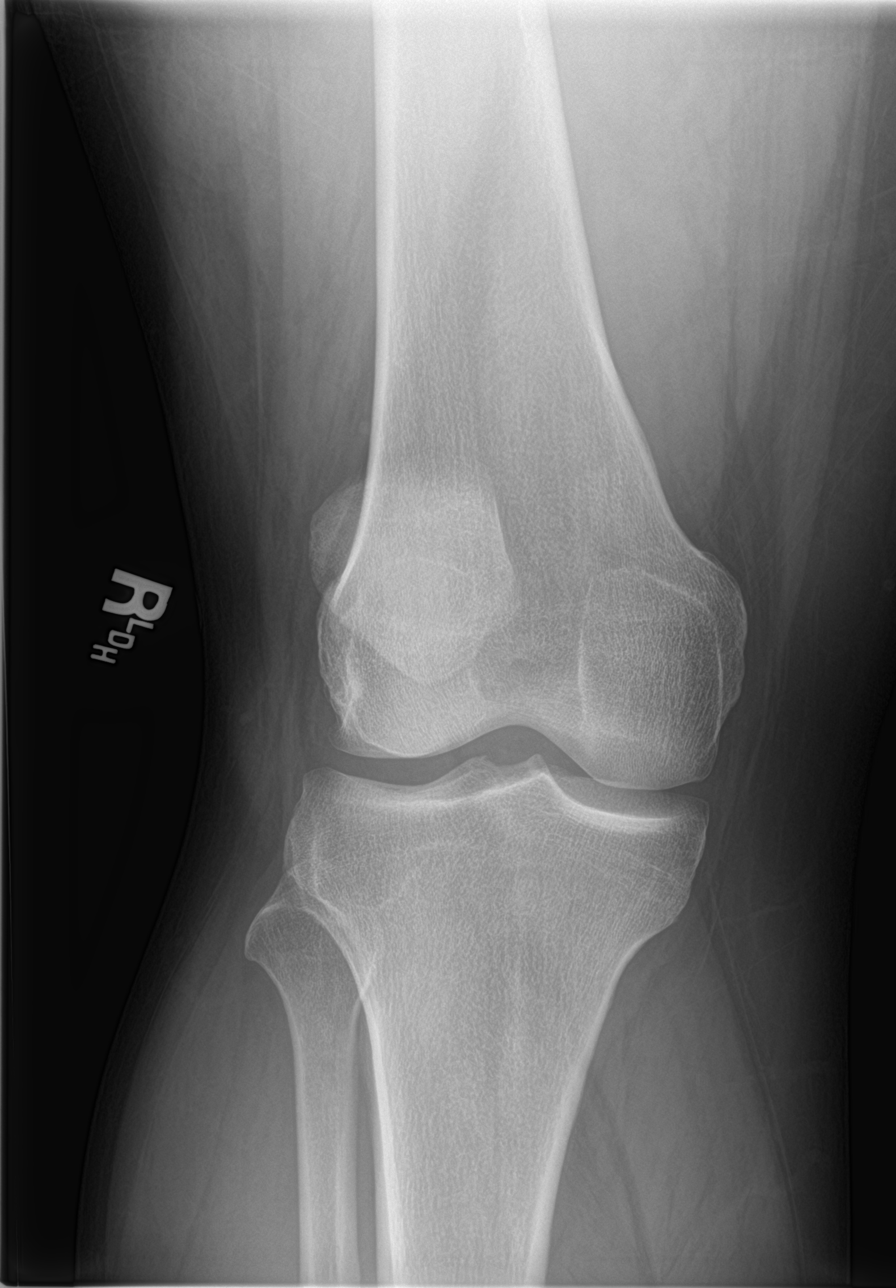
[im 2/4]
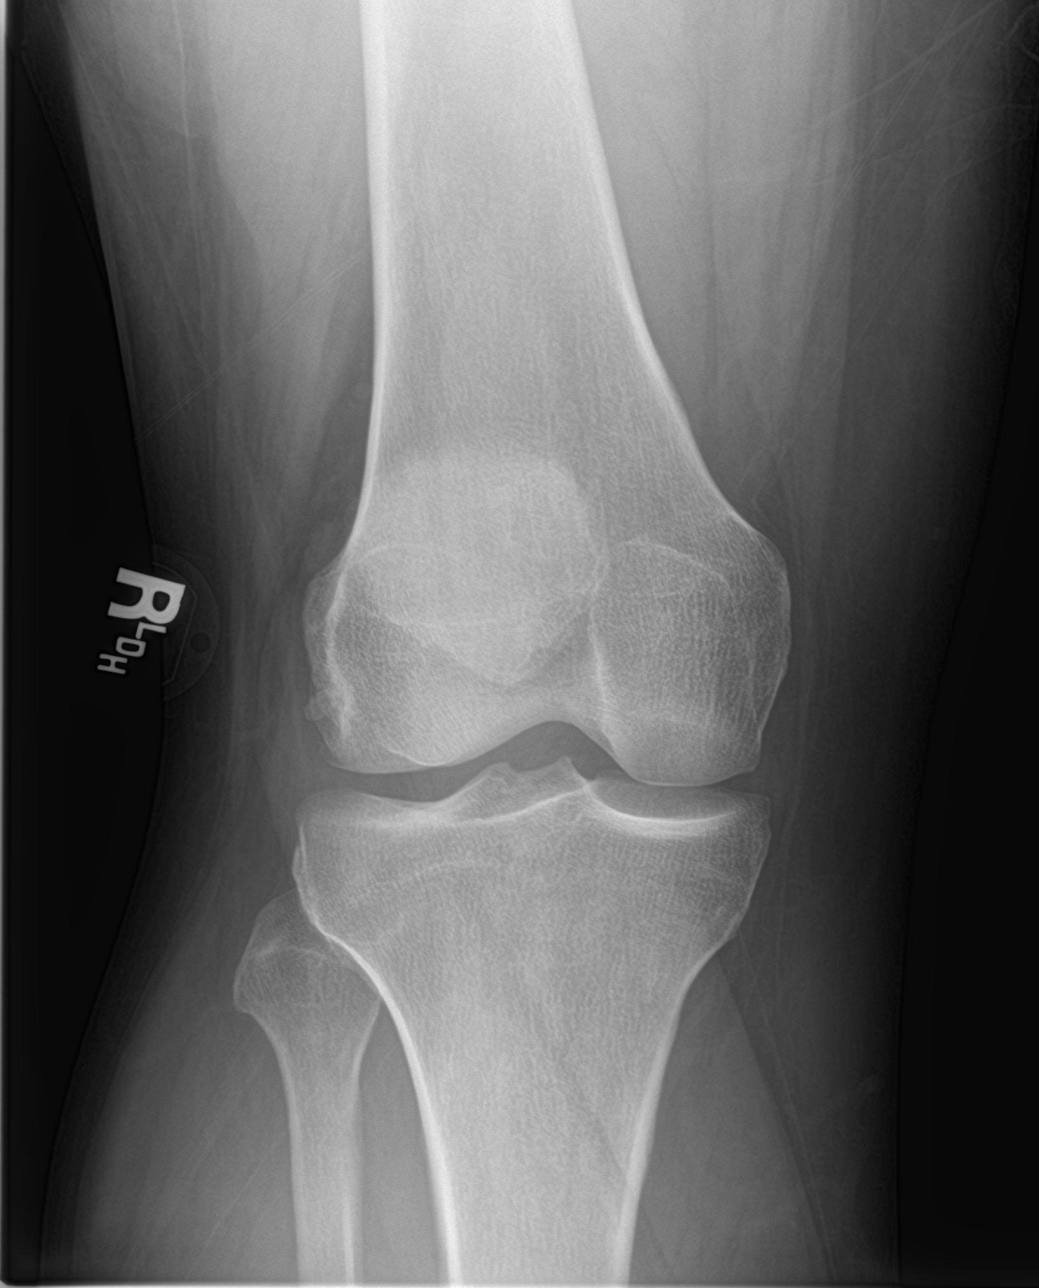
[im 3/4]
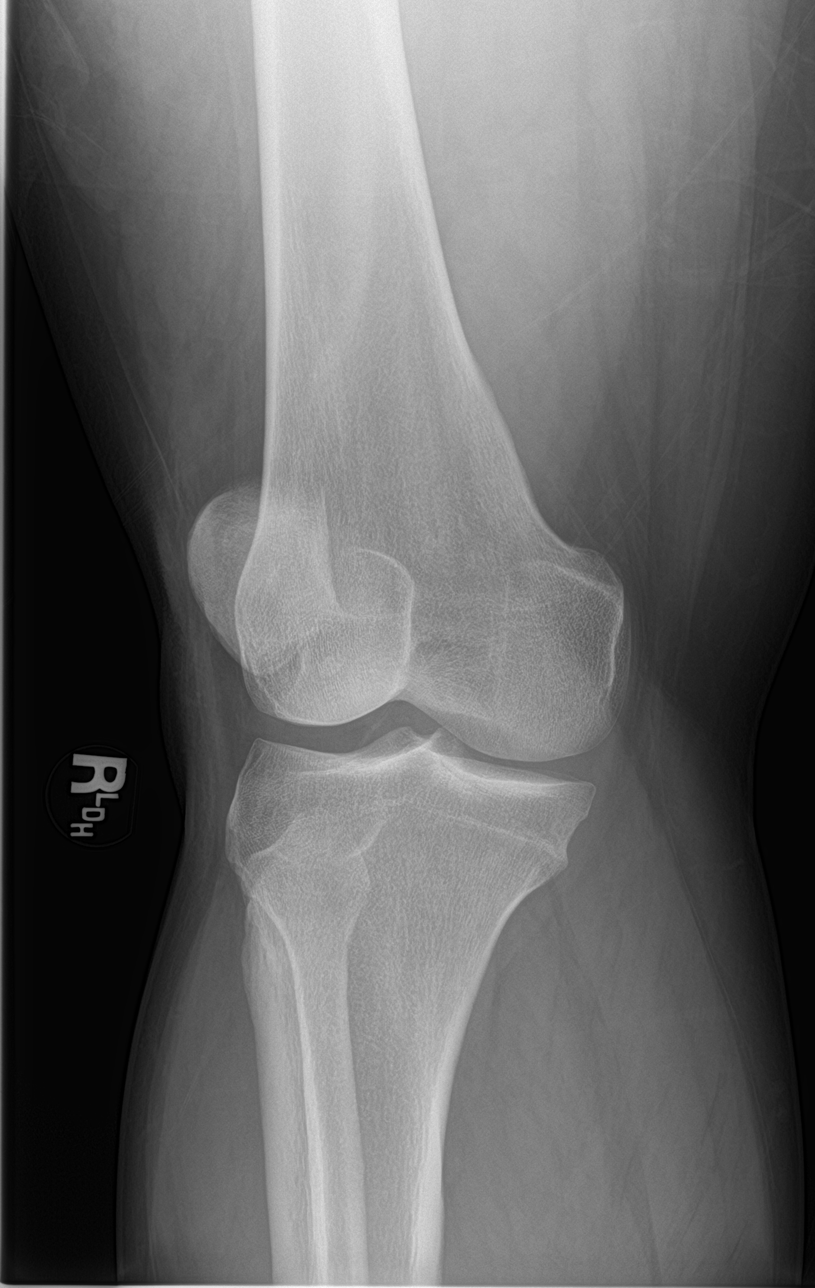
[im 4/4]
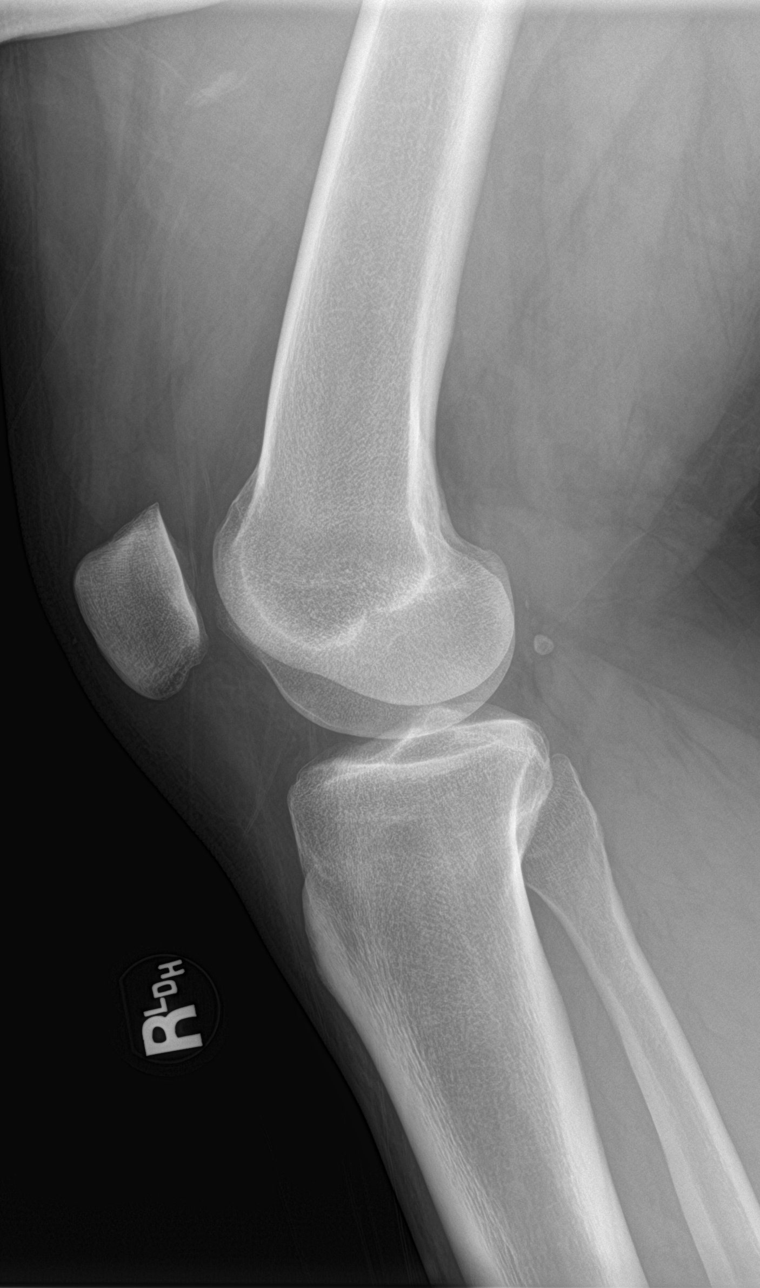

[4 of 4 positions shown; findings below may reference images not displayed]

FINDINGS: No evidence of fracture, dislocation, or joint effusion. No evidence
of arthropathy or other focal bone abnormality. Soft tissues are
unremarkable.
IMPRESSION: Negative exam.

## 2020-05-09 ENCOUNTER — Emergency Department: Payer: Self-pay

## 2020-05-09 ENCOUNTER — Other Ambulatory Visit: Payer: Self-pay

## 2020-05-09 ENCOUNTER — Emergency Department
Admission: EM | Admit: 2020-05-09 | Discharge: 2020-05-09 | Disposition: A | Discharge status: Home / Self Care | Payer: Self-pay | Attending: Emergency Medicine | Admitting: Emergency Medicine

## 2020-05-09 DIAGNOSIS — R0789 Other chest pain: Secondary | ICD-10-CM

## 2020-05-09 DIAGNOSIS — K219 Gastro-esophageal reflux disease without esophagitis: Secondary | ICD-10-CM | POA: Insufficient documentation

## 2020-05-09 DIAGNOSIS — F17210 Nicotine dependence, cigarettes, uncomplicated: Secondary | ICD-10-CM | POA: Insufficient documentation

## 2020-05-09 DIAGNOSIS — I10 Essential (primary) hypertension: Secondary | ICD-10-CM | POA: Insufficient documentation

## 2020-05-09 LAB — BASIC METABOLIC PANEL
Anion gap: 7 (ref 5–15)
BUN: 16 mg/dL (ref 6–20)
CO2: 23 mmol/L (ref 22–32)
Calcium: 8.8 mg/dL — ABNORMAL LOW (ref 8.9–10.3)
Chloride: 108 mmol/L (ref 98–111)
Creatinine, Ser: 1.07 mg/dL (ref 0.61–1.24)
GFR, Estimated: >60 mL/min (ref >60)
Glucose, Bld: 140 mg/dL — ABNORMAL HIGH (ref 70–99)
Potassium: 3.5 mmol/L (ref 3.5–5.1)
Sodium: 138 mmol/L (ref 135–145)

## 2020-05-09 LAB — CBC
HCT: 40.1 % (ref 39.0–52.0)
Hemoglobin: 13 g/dL (ref 13.0–17.0)
MCH: 27.9 pg (ref 26.0–34.0)
MCHC: 32.4 g/dL (ref 30.0–36.0)
MCV: 86.1 fL (ref 80.0–100.0)
Platelets: 235 10*3/uL (ref 150–400)
RBC: 4.66 MIL/uL (ref 4.22–5.81)
RDW: 13 % (ref 11.5–15.5)
WBC: 5.5 10*3/uL (ref 4.0–10.5)
nRBC: 0 % (ref 0.0–0.2)

## 2020-05-09 LAB — TROPONIN I (HIGH SENSITIVITY)
Troponin I (High Sensitivity): 10 ng/L (ref <18)
Troponin I (High Sensitivity): 8 ng/L (ref <18)

## 2020-05-09 MED ORDER — PREDNISONE 10 MG PO TABS: 10.0000 mg | ORAL_TABLET | Freq: Every day | ORAL | 0 refills | Status: DC

## 2020-05-09 NOTE — ED Notes (Signed)
Pt presents to ED with c/o of having chest pain that started about 2 weeks ago. Pt states he has also intermittent numbness and prickling in his L arm. Pt states he cant say what makes his pain better or worse at this time. Pt denies any HX of cardiac issues. Pt states a few instances of nausea.   Pt states to this RN "will I be in the hall like this", this RN apologized for the hallway placement and educated pt if he was to be here for a long period of time we would try and place him into a room.

## 2020-05-09 NOTE — ED Provider Notes (Signed)
Medical screening examination/treatment/procedure(s) were conducted as a shared visit with non-physician practitioner(s) and myself.  I personally evaluated the patient during the encounter.  Patient resting comfortably, feels well at this time.  Reassuring work-up.  EKG reviewed negative for evidence of acute ischemia.  2 normal troponins.  No significant risk factors for DVT or PE, and his chest pain seems quite reproducible.  Low risk Wynonia Musty, MD 05/09/20 1840

## 2020-05-09 NOTE — ED Triage Notes (Signed)
Pt states he's been having left sided cp for the past week and a half- pt states that the pain will radiate into neck and make his left arm feel numb- pt states he also has lower back pain sometimes- pt states he's been having some nausea- pt also has [facility] with it

## 2020-05-09 NOTE — Discharge Instructions (Signed)
Pick up the omeprazole over-the-counter for your acid reflux.  This is cheaper than having the prescription for this medicine.  Take 1 pill a day.

## 2020-05-09 NOTE — ED Provider Notes (Signed)
[facility] Emergency Department Provider Note  ____________________________________________  Time seen: Approximately 6:30 PM  I have reviewed the triage vital signs and the nursing notes.   HISTORY  Chief Complaint Chest Pain    HPI Patrick [name] is a 30 y.o. male who presents the emergency department complaining of intermittent pain to the left chest wall radiation down the left arm.  Patient states that he feels like this is a pulled muscle versus in his chest.  He states that most of it is along the chest wall itself.  He will have some radiation down the left arm.  Patient states that it is a shooting/burning sensation.  He states that sometimes this is aggravated by movement, sometimes will come without movement.  It is not constant.'s been ongoing for  2 weeks.  Patient denies any shortness of breath.  Pain is not pleuritic in nature.  No coughing, shortness of breath.  He does have a history of GERD but does not take medicines for same.  He states that this does not feel like his acid reflux.        Past Medical History:  Diagnosis Date  . Hypertension   . Sarcoidosis     Patient Active Problem List   Diagnosis Date Noted  . Mediastinal lymphadenopathy 02/15/2018    Past Surgical History:  Procedure Laterality Date  . FLEXIBLE BRONCHOSCOPY N/A 03/07/2018   Procedure: FLEXIBLE BRONCHOSCOPY;  Surgeon: Wilhelmina Mcardle, MD;  Location: [facility] ORS;  Service: Pulmonary;  Laterality: N/A;    Prior to Admission medications   Medication Sig Start Date End Date Taking? Authorizing Provider  predniSONE (DELTASONE) 10 MG tablet Take 1 tablet (10 mg total) by mouth daily. 05/09/20  Yes Cuthriell, Charline Bills, PA-C  ibuprofen (ADVIL) 600 MG tablet Take 1 tablet (600 mg total) by mouth every 8 (eight) hours as needed. 04/10/19   Johnn Hai, PA-C    Allergies Patient has no known allergies.  Family History  Problem Relation Age of Onset  . Kidney  cancer Maternal Grandmother     Social History Social History   Tobacco Use  . Smoking status: Current Every Day Smoker    Packs/day: 0.50    Types: Cigarettes  . Smokeless tobacco: Never Used  Vaping Use  . Vaping Use: Never used  Substance Use Topics  . Alcohol use: Yes  . Drug use: Not Currently     Review of Systems  Constitutional: No fever/chills Eyes: No visual changes. No discharge ENT: No upper respiratory complaints. Cardiovascular: Left-sided chest/chest wall pain Respiratory: no cough. No SOB. Gastrointestinal: No abdominal pain.  No nausea, no vomiting.  No diarrhea.  No constipation. Musculoskeletal: Negative for musculoskeletal pain. Skin: Negative for rash, abrasions, lacerations, ecchymosis. Neurological: Negative for headaches, focal weakness or numbness.  10 System ROS otherwise negative.  ____________________________________________   PHYSICAL EXAM:  VITAL SIGNS: ED Triage Vitals  Enc Vitals Group     BP 05/09/20 1514 (!) 151/107     Pulse Rate 05/09/20 1514 (!) 111     Resp 05/09/20 1514 20     Temp 05/09/20 1514 98.1 F (36.7 C)     Temp Source 05/09/20 1514 Oral     SpO2 05/09/20 1514 99 %     Weight 05/09/20 1515 (!) 320 lb (145.2 kg)     Height 05/09/20 1515 _0  (1.727 m)     Head Circumference --      Peak Flow --  Pain Score 05/09/20 1515 7     Pain Loc --      Pain Edu? --      Excl. in [place]? --      Constitutional: Alert and oriented. Well appearing and in no acute distress. Eyes: Conjunctivae are normal. PERRL. EOMI. Head: Atraumatic. ENT:      Ears:       Nose: No congestion/rhinnorhea.      Mouth/Throat: Mucous membranes are moist.  Neck: No stridor.  No cervical spine tenderness to palpation. Hematological/Lymphatic/Immunilogical: No cervical lymphadenopathy. Cardiovascular: Normal rate, regular rhythm. Normal S1 and S2.  Good peripheral circulation. Respiratory: Normal respiratory effort without tachypnea or  retractions. Lungs CTAB. Good air entry to the bases with no decreased or absent breath sounds. Gastrointestinal: Bowel sounds 4 quadrants. Soft and nontender to palpation. No guarding or rigidity. No palpable masses. No distention. No CVA tenderness. Musculoskeletal: Full range of motion to all extremities. No gross deformities appreciated.  Visualization of the left chest wall reveals no external findings.  Palpation along the left sternal border extending into the intercostal margins of the left ribs revealed tenderness which reproduced the patient's pain.  Good underlying breath sounds bilaterally.  Full range of motion to both upper extremities.  No tenderness over the osseous structures.  Sensation and pulses intact both upper extremities. Neurologic:  Normal speech and language. No gross focal neurologic deficits are appreciated.  Skin:  Skin is warm, dry and intact. No rash noted. Psychiatric: Mood and affect are normal. Speech and behavior are normal. Patient exhibits appropriate insight and judgement.   ____________________________________________   LABS (all labs ordered are listed, but only abnormal results are displayed)  Labs Reviewed  BASIC METABOLIC PANEL - Abnormal; Notable for the following components:      Result Value   Glucose, Bld 140 (*)    Calcium 8.8 (*)    All other components within normal limits  CBC  TROPONIN I (HIGH SENSITIVITY)  TROPONIN I (HIGH SENSITIVITY)   ____________________________________________  EKG   ____________________________________________  RADIOLOGY I personally viewed and evaluated these images as part of my medical decision making, as well as reviewing the written report by the radiologist.  ED Provider Interpretation: Visualization of chest x-ray revealed no consolidation concerning for pneumonia.  No other acute cardiopulmonary abnormality  DG Chest 2 View  Result Date: 05/09/2020 CLINICAL DATA:  Chest pain EXAM: CHEST - 2 VIEW  COMPARISON:  March 07, 2018 FINDINGS: The heart size and mediastinal contours are within normal limits. Both lungs are clear. The visualized skeletal structures are unremarkable. IMPRESSION: No active cardiopulmonary disease. Electronically Signed   By: Abelardo Diesel M.D.   On: 05/09/2020 15:37    ____________________________________________    PROCEDURES  Procedure(s) performed:    Procedures    Medications - No data to display   ____________________________________________   INITIAL IMPRESSION / ASSESSMENT AND PLAN / ED COURSE  Pertinent labs & imaging results that were available during my care of the patient were reviewed by me and considered in my medical decision making (see chart for details).  Review of the [place] CSRS was performed in accordance of the [place] prior to dispensing any controlled drugs.           Patient's diagnosis is consistent with chest wall pain/costochondritis.  Patient presented to the emergency department complaining of left-sided chest/chest wall pain.  This was reproduced on exam.  Patient states that he had been sick recently with a lot of coughing and  sneezing.  I think that this is likely costochondritis.  X-ray is reassuring, EKG is reassuring, troponins were not elevated.  Given the reproducibility, again I do suspect likely costochondritis origin of the pain.  Patient also has a history of GERD, symptoms are not consistent with his GERD but I will prescribe him medication as he does have an ongoing history of same..  No further work-up deemed necessary at this time.  Patient is stable for discharge.  Return precautions discussed with the patient.  Patient is given ED precautions to return to the ED for any worsening or new symptoms.     ____________________________________________  FINAL CLINICAL IMPRESSION(S) / ED DIAGNOSES  Final diagnoses:  Chest wall pain  Gastroesophageal reflux disease, unspecified whether esophagitis present       NEW MEDICATIONS STARTED DURING THIS VISIT:  ED Discharge Orders         Ordered    predniSONE (DELTASONE) 10 MG tablet  Daily       Note to Pharmacy: Take 6 pills x 2 days, 5 pills x 2 days, 4 pills x 2 days, 3 pills x 2 days, 2 pills x 2 days, and 1 pill x 2 days   05/09/20 1843              This chart was dictated using voice recognition software/Dragon. Despite best efforts to proofread, errors can occur which can change the meaning. Any change was purely unintentional.    Darletta Moll, PA-C 05/09/20 1843    Delman Kitten, MD 05/09/20 2232

## 2020-05-09 NOTE — ED Notes (Signed)
D/C and new RX discussed with pt, pt verbalized understanding. NAD noted.  

## 2021-04-17 ENCOUNTER — Other Ambulatory Visit: Payer: Self-pay

## 2021-04-17 ENCOUNTER — Emergency Department
Admission: EM | Admit: 2021-04-17 | Discharge: 2021-04-17 | Disposition: A | Discharge status: Home / Self Care | Payer: Self-pay | Attending: Emergency Medicine | Admitting: Emergency Medicine

## 2021-04-17 DIAGNOSIS — Z20822 Contact with and (suspected) exposure to covid-19: Secondary | ICD-10-CM | POA: Insufficient documentation

## 2021-04-17 DIAGNOSIS — I10 Essential (primary) hypertension: Secondary | ICD-10-CM | POA: Insufficient documentation

## 2021-04-17 DIAGNOSIS — Z720 Tobacco use: Secondary | ICD-10-CM | POA: Insufficient documentation

## 2021-04-17 DIAGNOSIS — R110 Nausea: Secondary | ICD-10-CM | POA: Insufficient documentation

## 2021-04-17 LAB — RESP PANEL BY RT-PCR (FLU A&B, COVID) ARPGX2
Influenza A by PCR: NEGATIVE
Influenza B by PCR: NEGATIVE
SARS Coronavirus 2 by RT PCR: NEGATIVE

## 2021-04-17 MED ORDER — ONDANSETRON 4 MG PO TBDP
4.0000 mg | ORAL_TABLET | Freq: Once | ORAL | Status: AC
  Administered 2021-04-17: 4 mg via ORAL
  Filled 2021-04-17: qty 1

## 2021-04-17 MED ORDER — LOSARTAN POTASSIUM 25 MG PO TABS: 25.0000 mg | ORAL_TABLET | Freq: Every day | ORAL | 11 refills | Status: DC

## 2021-04-17 MED ORDER — ONDANSETRON HCL 4 MG PO TABS: 4.0000 mg | ORAL_TABLET | Freq: Three times a day (TID) | ORAL | 0 refills | Status: AC | PRN

## 2021-04-17 NOTE — ED Provider Notes (Signed)
[facility] Provider Note    Event Date/Time   First MD Initiated Contact with Patient 04/17/21 1417     (approximate)   History   Hypertension   HPI  Patrick [name] is a 31 y.o. male with a past medical history of hypertension and obesity who presents for evaluation of 2 to 3 days of some lightheadedness and nausea.  Patient states he is not sure if he got food poisoning or not.  He states he also feels his blood pressure has been high as a has been out of his losartan for over a month.  He thinks this may be contributing.  He denies any chest pain, headache, vision changes, earache, sore throat, fevers, cough, shortness of breath, abdominal pain, vomiting, diarrhea rash or extremity pain.  He denies any illicit drug use and states he only occasionally drinks alcohol but no significant amount recently.  Endorses tobacco abuse.  Denies any SI or HI.  No recent falls or injuries.  No other acute concerns at this time.      Physical Exam  Triage Vital Signs: ED Triage Vitals  Enc Vitals Group     BP 04/17/21 1341 (!) 167/100     Pulse Rate 04/17/21 1341 88     Resp 04/17/21 1341 18     Temp 04/17/21 1341 98.4 F (36.9 C)     Temp Source 04/17/21 1341 Oral     SpO2 04/17/21 1341 97 %     Weight 04/17/21 1342 (!) 325 lb (147.4 kg)     Height 04/17/21 1342 5\' 8"  (1.727 m)     Head Circumference --      Peak Flow --      Pain Score 04/17/21 1342 0     Pain Loc --      Pain Edu? --      Excl. in [place]? --     Most recent vital signs: Vitals:   04/17/21 1341  BP: (!) 167/100  Pulse: 88  Resp: 18  Temp: 98.4 F (36.9 C)  SpO2: 97%    General: Awake, no distress.  CV:  Good peripheral perfusion.  Resp:  Normal effort.  Abd:  No distention.  Other:  Cranial nerves II through XII grossly intact.  No pronator drift.  No finger dysmetria.  Symmetric 5/5 strength of all extremities.  Sensation intact to light touch in all extremities.  Unremarkable  unassisted gait.    ED Results / Procedures / Treatments  Labs (all labs ordered are listed, but only abnormal results are displayed) Labs Reviewed  RESP PANEL BY RT-PCR (FLU A&B, COVID) ARPGX2  CBG MONITORING, ED     EKG  EKG is remarkable for sinus rhythm with a ventricular rate of 96 significant artifact versus nonspecific change in inferior and anterior leads without other clearance of acute ischemia or significant arrhythmia.   RADIOLOGY   PROCEDURES:     MEDICATIONS ORDERED IN ED: Medications  ondansetron (ZOFRAN-ODT) disintegrating tablet 4 mg (4 mg Oral Given 04/17/21 1533)     IMPRESSION / MDM / ASSESSMENT AND PLAN / ED COURSE  I reviewed the triage vital signs and the nursing notes.                              Patient presents with above-stated history exam for assessment of 2 to 3 days of some nausea and decreased appetite.  Patient states he is  also had a little bit of lightheadedness.  He is also requesting refill of his blood pressure medicines as he has not been taking this for several weeks.  On arrival he is hypertensive.  Suspect possibly infectious enteritis.  He has no fevers, tachycardia, low blood pressure or history of abdominal pain or any tenderness to stress and appendicitis, diverticulitis, kidney stone, cholecystitis or other acute abdominal pathology.  He has no headache or abnormal neurological findings to suggest [facility] or CVA.  EKG does have multiple nonspecific findings and I discussed at length with concerns with the patient that without blood work I could not assess for possible cardiac ischemia or strain on the heart was a cause of his nausea and dizziness.  He states he understands this and that he could have life-threatening process such as a heart attack and that I cannot diagnose this without blood work but he still wishes to leave against my medical advice.  He is unwilling to have his blood blood sugar rechecked.  He was discharged AMA and I  believe he had capacity to make this decision.     FINAL CLINICAL IMPRESSION(S) / ED DIAGNOSES   Final diagnoses:  Hypertension, unspecified type  Nausea  Tobacco abuse     Rx / DC Orders   ED Discharge Orders          Ordered    losartan (COZAAR) 25 MG tablet  Daily        04/17/21 1433    ondansetron (ZOFRAN) 4 MG tablet  Every 8 hours PRN        04/17/21 1443             Note:  This document was prepared using Dragon voice recognition software and may include unintentional dictation errors.   Gilles Chiquito, MD 04/17/21 [phone]

## 2021-04-17 NOTE — ED Triage Notes (Signed)
Pt states the last couple days he has been dizzy and nauseous- pt suspects it is his bp- pt has a hx of hypertension but states he has not been taking his meds regularly after his mother passed away a month ago- pt states he has not taken his medication today

## 2021-09-05 ENCOUNTER — Emergency Department
Admission: EM | Admit: 2021-09-05 | Discharge: 2021-09-05 | Disposition: A | Discharge status: Home / Self Care | Payer: 59 | Attending: Emergency Medicine | Admitting: Emergency Medicine

## 2021-09-05 ENCOUNTER — Other Ambulatory Visit: Payer: Self-pay

## 2021-09-05 ENCOUNTER — Emergency Department: Payer: 59

## 2021-09-05 DIAGNOSIS — R42 Dizziness and giddiness: Secondary | ICD-10-CM | POA: Diagnosis present

## 2021-09-05 DIAGNOSIS — I10 Essential (primary) hypertension: Secondary | ICD-10-CM | POA: Insufficient documentation

## 2021-09-05 DIAGNOSIS — F17200 Nicotine dependence, unspecified, uncomplicated: Secondary | ICD-10-CM | POA: Insufficient documentation

## 2021-09-05 LAB — BASIC METABOLIC PANEL
Anion gap: 5 (ref 5–15)
BUN: 15 mg/dL (ref 6–20)
CO2: 26 mmol/L (ref 22–32)
Calcium: 8.9 mg/dL (ref 8.9–10.3)
Chloride: 109 mmol/L (ref 98–111)
Creatinine, Ser: 1.11 mg/dL (ref 0.61–1.24)
GFR, Estimated: >60 mL/min (ref >60)
Glucose, Bld: 129 mg/dL — ABNORMAL HIGH (ref 70–99)
Potassium: 3.7 mmol/L (ref 3.5–5.1)
Sodium: 140 mmol/L (ref 135–145)

## 2021-09-05 LAB — CBC
HCT: 44.4 % (ref 39.0–52.0)
Hemoglobin: 14.2 g/dL (ref 13.0–17.0)
MCH: 27.7 pg (ref 26.0–34.0)
MCHC: 32 g/dL (ref 30.0–36.0)
MCV: 86.7 fL (ref 80.0–100.0)
Platelets: 218 10*3/uL (ref 150–400)
RBC: 5.12 MIL/uL (ref 4.22–5.81)
RDW: 12.8 % (ref 11.5–15.5)
WBC: 5.8 10*3/uL (ref 4.0–10.5)
nRBC: 0 % (ref 0.0–0.2)

## 2021-09-05 LAB — URINALYSIS, ROUTINE W REFLEX MICROSCOPIC
Bilirubin Urine: NEGATIVE
Glucose, UA: NEGATIVE mg/dL
Ketones, ur: NEGATIVE mg/dL
Nitrite: NEGATIVE
Protein, ur: NEGATIVE mg/dL
Specific Gravity, Urine: 1.02 (ref 1.005–1.030)
pH: 5.5 (ref 5.0–8.0)

## 2021-09-05 LAB — URINALYSIS, MICROSCOPIC (REFLEX)

## 2021-09-05 MED ORDER — LOSARTAN POTASSIUM 25 MG PO TABS: 25.0000 mg | ORAL_TABLET | Freq: Every day | ORAL | 11 refills | Status: AC

## 2021-09-05 NOTE — ED Triage Notes (Signed)
Pt to ED via POV from home. Pt reports he has been out of his BP medication (losartan) for about 2-3 days. Pt endorses CP, dizziness and nausea.

## 2021-09-05 NOTE — ED Provider Notes (Signed)
[facility] Provider Note    Event Date/Time   First MD Initiated Contact with Patient 09/05/21 1624     (approximate)   History   Dizziness   HPI  Patrick [name] is a 31 y.o. male past medical history of hypertension and sarcoidosis presents with elevated blood pressure and lightheadedness.  Patient has been out of his losartan for about 4 days.  Has not seen his primary doctor in the last 6 to 7 months.  He has noticed over the last 2 to 3 days that he feels lightheaded upon standing.  Denies associated shortness of breath nausea diaphoresis has not passed out.  Does work in hot temperature but says he has been staying hydrated denies diarrhea.  He also has had some intermittent short-lived sharp pinprick like chest pain.  Points to the left side of his chest described as a sharp sensation last for about 10 seconds and occurs while he is eating and is asymptomatic between episodes is not pleuritic or exertional. The patient denies hx of prior DVT/PE, unilateral leg pain/swelling, hormone use, recent surgery, hx of cancer, prolonged immobilization, or hemoptysis.  That he has had similar symptoms in the past when his blood pressure was elevated.  He denies dysuria penile discharge urinary urgency frequency.  Has been told he had blood in his urine in the past but was told it was not an issue.  Denies drug or alcohol use.  Does use tobacco.  No history of coronary disease.  Denies new headache visual change numbness tingling weakness.    Past Medical History:  Diagnosis Date   Hypertension    Sarcoidosis     Patient Active Problem List   Diagnosis Date Noted   Mediastinal lymphadenopathy 02/15/2018     Physical Exam  Triage Vital Signs: ED Triage Vitals  Enc Vitals Group     BP 09/05/21 1322 (!) 175/129     Pulse Rate 09/05/21 1321 (!) 107     Resp 09/05/21 1321 18     Temp 09/05/21 1321 98.6 F (37 C)     Temp Source 09/05/21 1321 Oral     SpO2  09/05/21 1321 97 %     Weight 09/05/21 1322 (!) 315 lb (142.9 kg)     Height 09/05/21 1322 5\' 8"  (1.727 m)     Head Circumference --      Peak Flow --      Pain Score 09/05/21 1321 0     Pain Loc --      Pain Edu? --      Excl. in GC? --     Most recent vital signs: Vitals:   09/05/21 1322 09/05/21 1627  BP: (!) 175/129 (!) 162/119  Pulse:  87  Resp:  18  Temp:    SpO2:  97%     General: Awake, no distress.  CV:  Good peripheral perfusion.  No edema or asymmetry Resp:  Normal effort.  Lungs are clear Abd:  No distention.  Abdomen is soft and nontender Neuro:             Awake, Alert, Oriented x 3  Other:     ED Results / Procedures / Treatments  Labs (all labs ordered are listed, but only abnormal results are displayed) Labs Reviewed  BASIC METABOLIC PANEL - Abnormal; Notable for the following components:      Result Value   Glucose, Bld 129 (*)    All other components within normal  limits  URINALYSIS, ROUTINE W REFLEX MICROSCOPIC - Abnormal; Notable for the following components:   Hgb urine dipstick MODERATE (*)    Leukocytes,Ua SMALL (*)    All other components within normal limits  URINALYSIS, MICROSCOPIC (REFLEX) - Abnormal; Notable for the following components:   Bacteria, UA FEW (*)    All other components within normal limits  CBC     EKG  EKG reviewed and interpreted by myself shows sinus tachycardia normal axis normal intervals no acute ischemic changes   RADIOLOGY I reviewed and interpreted the CXR which does not show any acute cardiopulmonary process    PROCEDURES:  Critical Care performed: No  Procedures    MEDICATIONS ORDERED IN ED: Medications - No data to display   IMPRESSION / MDM / ASSESSMENT AND PLAN / ED COURSE  I reviewed the triage vital signs and the nursing notes.                              Patient's presentation is most consistent with acute complicated illness / injury requiring diagnostic workup.  Differential  diagnosis includes, but is not limited to, orthostatic hypotension, dehydration, AKI, pulmonary embolism, acute coronary syndrome, GERD, musculoskeletal pain  The patient is a 31 year old male history of sarcoidosis and hypertension presents primarily for elevated blood pressure as well as lightheadedness upon standing and intermittent chest pain.  He is typically on losartan but has been out of it for the past 4 to 5 days.  Says blood pressure typically runs in the 160s.  He has had several episodes where he feels lightheaded upon standing and over the last 2 to 3 days while eating has had occasional sharp chest pain that lasts for about 10 to 15 seconds but is not associated with other symptoms.  Initial blood pressure was 175 systolic and he is mildly tachycardic but tachycardia resolved prior to any intervention blood pressure downtrended to 160/119.  Patient is asymptomatic now has not had any recent chest pain denies headache visual change numbness tingling or weakness.  EKG shows sinus tachycardia without ischemic changes BMP and CBC are reassuring.  UA was collected from triage for unclear reasons does show moderate blood 6-10 RBCs and 11-20 WBCs with few bacteria.  Patient however is asymptomatic and has no dysuria urgency frequency somewhat unusual for a man of his age to have a UTI especially given he is not symptomatic so will not treat.  He has had hematuria in the past will refer to urology.  I have low suspicion that patient's chest pain is ACS given he has minimal risk factors and it is quite atypical only occurring with eating suspect this more likely to be GI related.      FINAL CLINICAL IMPRESSION(S) / ED DIAGNOSES   Final diagnoses:  Hypertension, unspecified type     Rx / DC Orders   ED Discharge Orders          Ordered    losartan (COZAAR) 25 MG tablet  Daily        09/05/21 1717             Note:  This document was prepared using Dragon voice recognition software  and may include unintentional dictation errors.   Georga Hacking, MD 09/05/21 [phone]

## 2021-09-05 NOTE — ED Provider Triage Note (Signed)
Emergency Medicine Provider Triage Evaluation Note  Patrick [name] , a 31 y.o. male  was evaluated in triage.  Pt complains of blood pressure problems.  Out of blood pressure medication for 2 to 3 days.  Patient also has had episode of chest pain, dizziness, diaphoresis and nausea.  Review of Systems  Positive: CP, shortness of breath, dizziness and headache. Negative: Vomiting, diarrhea.  Physical Exam  BP (!) 175/129   Pulse (!) 107   Temp 98.6 F (37 C) (Oral)   Resp 18   Ht 5\' 8"  (1.727 m)   Wt (!) 142.9 kg   SpO2 97%   BMI 47.90 kg/m  Gen:   Awake, no distress   Resp:  Normal effort  MSK:   Moves extremities without difficulty  Other:  Ambulatory without any assistance.  Medical Decision Making  Medically screening exam initiated at 1:26 PM.  Appropriate orders placed.  Patrick [name] was informed that the remainder of the evaluation will be completed by another provider, this initial triage assessment does not replace that evaluation, and the importance of remaining in the ED until their evaluation is complete.     Jill Alexanders, PA-C 09/05/21 1331

## 2021-09-05 NOTE — Discharge Instructions (Addendum)
I have sent a refill of her losartan to your pharmacy.  Please continue to take this medication and follow-up with your primary doctor.

## 2022-07-18 ENCOUNTER — Encounter: Payer: Self-pay | Admitting: Emergency Medicine

## 2022-07-18 ENCOUNTER — Emergency Department
Admission: EM | Admit: 2022-07-18 | Discharge: 2022-07-18 | Disposition: A | Discharge status: Home / Self Care | Payer: 59 | Attending: Emergency Medicine | Admitting: Emergency Medicine

## 2022-07-18 ENCOUNTER — Emergency Department: Payer: 59

## 2022-07-18 ENCOUNTER — Other Ambulatory Visit: Payer: Self-pay

## 2022-07-18 DIAGNOSIS — W228XXA Striking against or struck by other objects, initial encounter: Secondary | ICD-10-CM | POA: Insufficient documentation

## 2022-07-18 DIAGNOSIS — Y990 Civilian activity done for income or pay: Secondary | ICD-10-CM | POA: Insufficient documentation

## 2022-07-18 DIAGNOSIS — S99911A Unspecified injury of right ankle, initial encounter: Secondary | ICD-10-CM | POA: Diagnosis present

## 2022-07-18 DIAGNOSIS — S93401A Sprain of unspecified ligament of right ankle, initial encounter: Secondary | ICD-10-CM | POA: Diagnosis not present

## 2022-07-18 DIAGNOSIS — I10 Essential (primary) hypertension: Secondary | ICD-10-CM | POA: Diagnosis not present

## 2022-07-18 NOTE — ED Provider Notes (Signed)
   [facility] Provider Note    Event Date/Time   First MD Initiated Contact with Patient 07/18/22 0505     (approximate)   History   Ankle Pain   HPI  Patrick [name] is a 32 y.o. male with a history of hypertension who presents with right ankle pain after hitting it on a piece of equipment at work yesterday causing the foot to plantarflex fully.  He has pain over the front part of the ankle bilaterally.  He denies any other injuries.  He has been able to walk and bear weight without significant difficulty.    Physical Exam   Triage Vital Signs: ED Triage Vitals [07/18/22 0452]  Enc Vitals Group     BP (!) 180/115     Pulse Rate 68     Resp 20     Temp 98.2 F (36.8 C)     Temp Source Oral     SpO2 95 %     Weight 298 lb (135.2 kg)     Height 5\' 7"  (1.702 m)     Head Circumference      Peak Flow      Pain Score 7     Pain Loc      Pain Edu?      Excl. in GC?     Most recent vital signs: Vitals:   07/18/22 0452  BP: (!) 180/115  Pulse: 68  Resp: 20  Temp: 98.2 F (36.8 C)  SpO2: 95%     General: Awake, no distress.  CV:  Good peripheral perfusion.  Resp:  Normal effort.  Abd:  No distention.  Other:  Mild bilateral anterior ankle tenderness.  No significant malleoli or tenderness.  No deformity.  Minimal swelling.  2+ DP pulse.  Full range of motion at the ankle.  No tenderness to the metatarsals or the rest of the foot.   ED Results / Procedures / Treatments   Labs (all labs ordered are listed, but only abnormal results are displayed) Labs Reviewed - No data to display   EKG     RADIOLOGY  XR R ankle: I independently viewed and interpreted the images; there is no acute fracture  PROCEDURES:  Critical Care performed: No  Procedures   MEDICATIONS ORDERED IN ED: Medications - No data to display   IMPRESSION / MDM / ASSESSMENT AND PLAN / ED COURSE  I reviewed the triage vital signs and the nursing  notes.  Differential diagnosis includes, but is not limited to, ankle sprain, muscle strain, less likely fracture.  We will obtain x-ray and reassess.  Patient's presentation is most consistent with acute complicated illness / injury requiring diagnostic workup.  X-ray shows no acute fractures.  The patient is stable for discharge home.  RICE instructions and return precautions provided, and he expresses understanding.   FINAL CLINICAL IMPRESSION(S) / ED DIAGNOSES   Final diagnoses:  Sprain of right ankle, unspecified ligament, initial encounter     Rx / DC Orders   ED Discharge Orders     None        Note:  This document was prepared using Dragon voice recognition software and may include unintentional dictation errors.    Dionne Bucy, MD 07/18/22 [phone]

## 2022-07-18 NOTE — ED Triage Notes (Signed)
Patient ambulatory to triage with steady gait, without difficulty or distress noted; pt reports hitting his rt ankle on equipment at work yesterday; c/o persistent pain; denies desire to file workers comp at this time
